# Patient Record
Sex: Male | Born: 1940 | Hispanic: No | Marital: Married | Smoking: Smoker, current status unknown
Health system: Southern US, Community
[De-identification: ages and names within clinical notes are randomized; demographics above are authoritative.]

## PROBLEM LIST (undated history)

## (undated) DIAGNOSIS — T8149XA Infection following a procedure, other surgical site, initial encounter: Secondary | ICD-10-CM

## (undated) DIAGNOSIS — K810 Acute cholecystitis: Secondary | ICD-10-CM

## (undated) DIAGNOSIS — E119 Type 2 diabetes mellitus without complications: Secondary | ICD-10-CM

## (undated) DIAGNOSIS — I2699 Other pulmonary embolism without acute cor pulmonale: Secondary | ICD-10-CM

## (undated) DIAGNOSIS — J181 Lobar pneumonia, unspecified organism: Secondary | ICD-10-CM

## (undated) DIAGNOSIS — I82409 Acute embolism and thrombosis of unspecified deep veins of unspecified lower extremity: Secondary | ICD-10-CM

## (undated) DIAGNOSIS — J9621 Acute and chronic respiratory failure with hypoxia: Secondary | ICD-10-CM

## (undated) HISTORY — PX: CHOLECYSTECTOMY: SHX55

---

## 2017-12-16 ENCOUNTER — Other Ambulatory Visit (HOSPITAL_COMMUNITY): Payer: Medicare Other

## 2017-12-16 ENCOUNTER — Inpatient Hospital Stay
Admission: RE | Admit: 2017-12-16 | Discharge: 2018-02-09 | Disposition: A | Discharge status: Skilled Nursing Facility | Payer: Medicare Other | Source: Ambulatory Visit | Attending: Internal Medicine | Admitting: Internal Medicine

## 2017-12-16 DIAGNOSIS — R1310 Dysphagia, unspecified: Secondary | ICD-10-CM

## 2017-12-16 DIAGNOSIS — K810 Acute cholecystitis: Secondary | ICD-10-CM

## 2017-12-16 DIAGNOSIS — T85598A Other mechanical complication of other gastrointestinal prosthetic devices, implants and grafts, initial encounter: Secondary | ICD-10-CM

## 2017-12-16 DIAGNOSIS — R0902 Hypoxemia: Secondary | ICD-10-CM

## 2017-12-16 DIAGNOSIS — T8149XA Infection following a procedure, other surgical site, initial encounter: Secondary | ICD-10-CM

## 2017-12-16 DIAGNOSIS — I10 Essential (primary) hypertension: Secondary | ICD-10-CM

## 2017-12-16 DIAGNOSIS — J9621 Acute and chronic respiratory failure with hypoxia: Secondary | ICD-10-CM

## 2017-12-16 DIAGNOSIS — J189 Pneumonia, unspecified organism: Secondary | ICD-10-CM

## 2017-12-16 DIAGNOSIS — T8143XA Infection following a procedure, organ and space surgical site, initial encounter: Secondary | ICD-10-CM

## 2017-12-16 DIAGNOSIS — I2699 Other pulmonary embolism without acute cor pulmonale: Secondary | ICD-10-CM

## 2017-12-16 DIAGNOSIS — I82409 Acute embolism and thrombosis of unspecified deep veins of unspecified lower extremity: Secondary | ICD-10-CM

## 2017-12-16 DIAGNOSIS — R0682 Tachypnea, not elsewhere classified: Secondary | ICD-10-CM

## 2017-12-16 DIAGNOSIS — Z0189 Encounter for other specified special examinations: Secondary | ICD-10-CM

## 2017-12-16 DIAGNOSIS — J181 Lobar pneumonia, unspecified organism: Secondary | ICD-10-CM

## 2017-12-16 DIAGNOSIS — K567 Ileus, unspecified: Secondary | ICD-10-CM

## 2017-12-16 DIAGNOSIS — R509 Fever, unspecified: Secondary | ICD-10-CM

## 2017-12-16 DIAGNOSIS — J9690 Respiratory failure, unspecified, unspecified whether with hypoxia or hypercapnia: Secondary | ICD-10-CM

## 2017-12-16 DIAGNOSIS — Z978 Presence of other specified devices: Secondary | ICD-10-CM

## 2017-12-16 HISTORY — DX: Lobar pneumonia, unspecified organism: J18.1

## 2017-12-16 HISTORY — DX: Other pulmonary embolism without acute cor pulmonale: I26.99

## 2017-12-16 HISTORY — DX: Infection following a procedure, other surgical site, initial encounter: T81.49XA

## 2017-12-16 HISTORY — DX: Acute and chronic respiratory failure with hypoxia: J96.21

## 2017-12-16 HISTORY — DX: Acute cholecystitis: K81.0

## 2017-12-16 HISTORY — DX: Acute embolism and thrombosis of unspecified deep veins of unspecified lower extremity: I82.409

## 2017-12-16 HISTORY — DX: Type 2 diabetes mellitus without complications: E11.9

## 2017-12-16 LAB — CBC WITH DIFFERENTIAL/PLATELET
Basophils Absolute: 0.1 10*3/uL (ref 0.0–0.1)
Basophils Relative: 1 %
EOS PCT: 2 %
Eosinophils Absolute: 0.2 10*3/uL (ref 0.0–0.7)
HEMATOCRIT: 28.2 % — AB (ref 39.0–52.0)
Hemoglobin: 8.3 g/dL — ABNORMAL LOW (ref 13.0–17.0)
LYMPHS ABS: 1.7 10*3/uL (ref 0.7–4.0)
Lymphocytes Relative: 16 %
MCH: 29.6 pg (ref 26.0–34.0)
MCHC: 29.4 g/dL — ABNORMAL LOW (ref 30.0–36.0)
MCV: 100.7 fL — AB (ref 78.0–100.0)
MONOS PCT: 8 %
Monocytes Absolute: 0.9 10*3/uL (ref 0.1–1.0)
Neutro Abs: 8 10*3/uL — ABNORMAL HIGH (ref 1.7–7.7)
Neutrophils Relative %: 73 %
Platelets: 202 10*3/uL (ref 150–400)
RBC: 2.8 MIL/uL — AB (ref 4.22–5.81)
RDW: 17.7 % — ABNORMAL HIGH (ref 11.5–15.5)
WBC: 10.9 10*3/uL — ABNORMAL HIGH (ref 4.0–10.5)

## 2017-12-16 LAB — HEPARIN LEVEL (UNFRACTIONATED): Heparin Unfractionated: 0.64 IU/mL (ref 0.30–0.70)

## 2017-12-16 LAB — BLOOD GAS, ARTERIAL
ACID-BASE EXCESS: 0.4 mmol/L (ref 0.0–2.0)
BICARBONATE: 26.1 mmol/L (ref 20.0–28.0)
FIO2: 40
LHR: 16 {breaths}/min
O2 Saturation: 95.6 %
PEEP/CPAP: 5 cmH2O
PRESSURE CONTROL: 18 cmH2O
Patient temperature: 98.6
pCO2 arterial: 54.9 mmHg — ABNORMAL HIGH (ref 32.0–48.0)
pH, Arterial: 7.298 — ABNORMAL LOW (ref 7.350–7.450)
pO2, Arterial: 78.5 mmHg — ABNORMAL LOW (ref 83.0–108.0)

## 2017-12-16 LAB — APTT: APTT: 120 s — AB (ref 24–36)

## 2017-12-16 LAB — PROTIME-INR
INR: 1.33
Prothrombin Time: 16.4 seconds — ABNORMAL HIGH (ref 11.4–15.2)

## 2017-12-17 ENCOUNTER — Other Ambulatory Visit (HOSPITAL_COMMUNITY): Payer: Medicare Other

## 2017-12-17 LAB — CBC
HEMATOCRIT: 24.8 % — AB (ref 39.0–52.0)
HEMOGLOBIN: 7.6 g/dL — AB (ref 13.0–17.0)
MCH: 30.3 pg (ref 26.0–34.0)
MCHC: 30.6 g/dL (ref 30.0–36.0)
MCV: 98.8 fL (ref 78.0–100.0)
Platelets: 205 10*3/uL (ref 150–400)
RBC: 2.51 MIL/uL — ABNORMAL LOW (ref 4.22–5.81)
RDW: 17.7 % — ABNORMAL HIGH (ref 11.5–15.5)
WBC: 10.9 10*3/uL — AB (ref 4.0–10.5)

## 2017-12-17 LAB — COMPREHENSIVE METABOLIC PANEL
ALBUMIN: 1.4 g/dL — AB (ref 3.5–5.0)
ALK PHOS: 105 U/L (ref 38–126)
ALT: 97 U/L — ABNORMAL HIGH (ref 17–63)
AST: 89 U/L — AB (ref 15–41)
Anion gap: 8 (ref 5–15)
BUN: 92 mg/dL — AB (ref 6–20)
CALCIUM: 8.2 mg/dL — AB (ref 8.9–10.3)
CO2: 24 mmol/L (ref 22–32)
CREATININE: 2.86 mg/dL — AB (ref 0.61–1.24)
Chloride: 112 mmol/L — ABNORMAL HIGH (ref 101–111)
GFR calc Af Amer: 23 mL/min — ABNORMAL LOW (ref >60)
GFR calc non Af Amer: 20 mL/min — ABNORMAL LOW (ref >60)
GLUCOSE: 130 mg/dL — AB (ref 65–99)
Potassium: 4.2 mmol/L (ref 3.5–5.1)
Sodium: 144 mmol/L (ref 135–145)
TOTAL PROTEIN: 6.8 g/dL (ref 6.5–8.1)
Total Bilirubin: 0.4 mg/dL (ref 0.3–1.2)

## 2017-12-17 LAB — HEPARIN LEVEL (UNFRACTIONATED)
Heparin Unfractionated: 0.54 IU/mL (ref 0.30–0.70)
Heparin Unfractionated: 0.4 IU/mL (ref 0.30–0.70)
Heparin Unfractionated: 0.55 IU/mL (ref 0.30–0.70)

## 2017-12-17 NOTE — H&P (Addendum)
Chief Complaint: Long term vent dependence  Referring Physician(s): Hijazi  Supervising Physician: Richarda Overlie  Patient Status: [name] - In-pt  History of Present Illness: Rodney Cordova is a 77 y.o. male who underwent a laparoscopic cholecystectomy on 11/11/2017 for gangrenous cholecystitis.  He continued to have abdominal pain and was found to have a large fluid collection in the GB fossa.  Perc drain was placed at an outside facility.  His hospital stay has been complicated by DVT. He was on Eliquis but the wife says "it didn't touch it so they put him on Heparin".  He is currently on a heparin drip. He remains intubated as he has not been able to wean.  We are asked to evaluate him for percutaneous gastrostomy tube placement.  His wife tells me that "they are planning a trach and PEG".   Allergies: Patient has no allergy information on record.  Medications: Currently on Heparin drip       Social History   Socioeconomic History  . Marital status: Married    Spouse name: Not on file  . Number of children: Not on file  . Years of education: Not on file  . Highest education level: Not on file  Occupational History  . Not on file  Social Needs  . Financial resource strain: Not on file  . Food insecurity:    Worry: Not on file    Inability: Not on file  . Transportation needs:    Medical: Not on file    Non-medical: Not on file  Tobacco Use  . Smoking status: Not on file  Substance and Sexual Activity  . Alcohol use: Not on file  . Drug use: Not on file  . Sexual activity: Not on file  Lifestyle  . Physical activity:    Days per week: Not on file    Minutes per session: Not on file  . Stress: Not on file  Relationships  . Social connections:    Talks on phone: Not on file    Gets together: Not on file    Attends religious service: Not on file    Active member of club or organization: Not on file    Attends meetings of clubs or organizations: Not on  file    Relationship status: Not on file  Other Topics Concern  . Not on file  Social History Narrative  . Not on file    Review of Systems  Unable to perform ROS: Intubated    Vital Signs: There were no vitals taken for this visit.  Physical Exam  Constitutional: He appears well-developed.  HENT:  Head: Normocephalic.  Cardiovascular: Normal rate and regular rhythm.  Pulmonary/Chest:  Intubated, on vent Rhonchi bilaterally  Abdominal:  RUQ drain in place with brown drainage  Neurological:  Does not open eyes to stimulation    Imaging: Dg Chest Port 1 View  Result Date: 12/16/2017 CLINICAL DATA:  Endotracheal tube placement. EXAM: PORTABLE CHEST 1 VIEW COMPARISON:  None. FINDINGS: Endotracheal tube terminates 2.5 cm above the carina. The balloon may be hyperinflated. Nasogastric tube terminates in the stomach. Left IJ central line tip may be directed posteriorly within the azygos vein. There is patchy bibasilar airspace opacification. Suspect a small right pleural effusion. IMPRESSION: 1. Endotracheal tube is slightly low lying. Pulling back approximately 1 cm would likely better position the tip above the carina. Difficult to exclude associated balloon hyperinflation. 2. Left IJ central line tip may be directed posteriorly, within the azygos vein. 3.  Patchy bilateral airspace opacification may be due to edema or pneumonia. 4. Probable right pleural effusion. Electronically Signed   By: Leanna BattlesMelinda  Blietz M.D.   On: 12/16/2017 15:21   Dg Abd Portable 1v  Result Date: 12/16/2017 CLINICAL DATA:  Nasogastric tube placement. EXAM: PORTABLE ABDOMEN - 1 VIEW COMPARISON:  None. FINDINGS: Nasogastric tube terminates in the stomach. IVC filter is noted. Gas is seen in the colon. IMPRESSION: Nasogastric tube terminates in the stomach. Electronically Signed   By: Leanna BattlesMelinda  Blietz M.D.   On: 12/16/2017 15:22    Labs:  CBC: Recent Labs    12/16/17 1603 12/17/17 0739  WBC 10.9* 10.9*    HGB 8.3* 7.6*  HCT 28.2* 24.8*  PLT 202 205    COAGS: Recent Labs    12/16/17 1603  INR 1.33  APTT 120*    BMP: Recent Labs    12/17/17 0739  NA 144  K 4.2  CL 112*  CO2 24  GLUCOSE 130*  BUN 92*  CALCIUM 8.2*  CREATININE 2.86*  GFRNONAA 20*  GFRAA 23*    LIVER FUNCTION TESTS: Recent Labs    12/17/17 0739  BILITOT 0.4  AST 89*  ALT 97*  ALKPHOS 105  PROT 6.8  ALBUMIN 1.4*    TUMOR MARKERS: No results for input(s): AFPTM, CEA, CA199, CHROMGRNA in the last 8760 hours.  Assessment and Plan:  Recent surgery for gangrenous cholecystitis with development of post op fluid collection.  RUQ drain remains in place.  Patient on Heparin drip for DVT.  Asked to evaluate the drain and evaluate for gastrostomy tube placement.  CT scan with oral contrast has been ordered. Cannot give IV contrast due to CKD with creatinine of 3.  May go to OR for Trach this week.  Thank you for this interesting consult.  I greatly enjoyed meeting Rodney Cordova and look forward to participating in their care.  A copy of this report was sent to the requesting provider on this date.  Electronically Signed: Gwynneth MacleodWENDY S BLAIR, PA-C 12/17/2017, 10:26 AM   I spent a total of 40 Minutes in face to face in clinical consultation, greater than 50% of which was counseling/coordinating care for eval drain from outside facility and eval for gtube.

## 2017-12-18 ENCOUNTER — Encounter: Payer: Self-pay | Admitting: Internal Medicine

## 2017-12-18 DIAGNOSIS — J181 Lobar pneumonia, unspecified organism: Secondary | ICD-10-CM | POA: Diagnosis not present

## 2017-12-18 DIAGNOSIS — I82409 Acute embolism and thrombosis of unspecified deep veins of unspecified lower extremity: Secondary | ICD-10-CM

## 2017-12-18 DIAGNOSIS — T8149XA Infection following a procedure, other surgical site, initial encounter: Secondary | ICD-10-CM | POA: Diagnosis not present

## 2017-12-18 DIAGNOSIS — I2699 Other pulmonary embolism without acute cor pulmonale: Secondary | ICD-10-CM

## 2017-12-18 DIAGNOSIS — J9621 Acute and chronic respiratory failure with hypoxia: Secondary | ICD-10-CM | POA: Diagnosis not present

## 2017-12-18 DIAGNOSIS — K810 Acute cholecystitis: Secondary | ICD-10-CM

## 2017-12-18 DIAGNOSIS — T8143XA Infection following a procedure, organ and space surgical site, initial encounter: Secondary | ICD-10-CM

## 2017-12-18 DIAGNOSIS — J8489 Other specified interstitial pulmonary diseases: Secondary | ICD-10-CM | POA: Diagnosis not present

## 2017-12-18 DIAGNOSIS — I10 Essential (primary) hypertension: Secondary | ICD-10-CM

## 2017-12-18 DIAGNOSIS — K651 Peritoneal abscess: Secondary | ICD-10-CM

## 2017-12-18 DIAGNOSIS — J15212 Pneumonia due to Methicillin resistant Staphylococcus aureus: Secondary | ICD-10-CM | POA: Diagnosis not present

## 2017-12-18 HISTORY — DX: Infection following a procedure, organ and space surgical site, initial encounter: T81.43XA

## 2017-12-18 HISTORY — DX: Lobar pneumonia, unspecified organism: J18.1

## 2017-12-18 HISTORY — DX: Acute embolism and thrombosis of unspecified deep veins of unspecified lower extremity: I82.409

## 2017-12-18 HISTORY — DX: Acute cholecystitis: K81.0

## 2017-12-18 HISTORY — DX: Other pulmonary embolism without acute cor pulmonale: I26.99

## 2017-12-18 HISTORY — DX: Peritoneal abscess: K65.1

## 2017-12-18 HISTORY — DX: Acute and chronic respiratory failure with hypoxia: J96.21

## 2017-12-18 LAB — HEPARIN LEVEL (UNFRACTIONATED)
Heparin Unfractionated: 0.38 IU/mL (ref 0.30–0.70)
Heparin Unfractionated: 0.22 IU/mL — ABNORMAL LOW (ref 0.30–0.70)
Heparin Unfractionated: 0.31 IU/mL (ref 0.30–0.70)

## 2017-12-18 NOTE — Progress Notes (Signed)
Patient ID: Rodney Cordova, male   DOB: 10/27/1940, 77 y.o.   MRN: [id number]   Request for percutaneous gastric tube placement  Imaging reviewed with Dr Miles CostainShick  CT 3/24: IMPRESSION: Pigtail drainage catheter in the right subhepatic space with only small amount of fluid which may represent loculated ascites, or potentially infection. Comparison with any outside imaging may be useful.  Circumferential colonic wall thickening involving cecum, ascending colon, hepatic flexure representing nonspecific colitis, potentially infectious or inflammatory.  Mixed ground-glass and nodular opacities of the bilateral lung bases, left greater than right. Differential includes multifocal infection, asymmetric edema, and/or chronic changes.  Right-sided pleural effusion.  L1 compression fracture with questionable disruption of the superior endplate and approximately 50% height loss anteriorly. If there is concern for acute component, MRI recommended as the test of choice to assess for edema.  Gas within the urinary bladder, presumably secondary to the indwelling balloon retention catheter.  Diverticular disease without evidence of acute diverticulitis.  Aortic Atherosclerosis (ICD10-I70.0).   Many issues with this patient that needs to be addressed and resolved Before IR can safely place a percutaneous tube per Dr Miles CostainShick  Will cancel request for now Please address all issues Re order when pt mor appropriate

## 2017-12-18 NOTE — Consult Note (Addendum)
Pulmonary Critical Care Medicine [facility] PULMONARY SERVICE  Date of Service: December 18, 2017   consult note   Rodney Cordova  ZOX:[id number]  DOB: 08-Sep-1941   DOA: 12/16/2017  Referring Physician: Carron Curie, MD   HPI: Rodney Cordova is a 77 y.o. male seen for follow up of Acute on Chronic Respiratory Failure.  Patient has a rather complicated history presented to the hospital for a laparoscopic cholecystectomy.  Patient was apparently found to gangrenous cholecystitis about 2 weeks prior to admission.  Patient had a drain placed and had significant complications with bruising in the right flanks.  He was evaluated for right lower extremity DVT which was apparently positive and also had other complications despite anticoagulation with presentation with pulmonary embolism.  Hospital course patient was started on heparin for anticoagulation.  Had a drain placed for a gallbladder fossa abscess.  In addition he was found to have new knee effusions.  Patient was started on broad-spectrum antibiotics.  In addition patient was found to have a pneumonia for which he was treated.  Patient had been extubated however failed and had to be placed back on the ventilator.  Presents to our facility on the ventilator and has been on full support and pressure assist-control mode currently with 40% oxygen.   Review of Systems:   ROS performed and is unremarkable other than noted above.  Past Medical History:  Diagnosis Date  . Acute cholecystitis 12/18/2017  . Acute DVT (deep venous thrombosis) (HCC) 12/18/2017  . Acute on chronic respiratory failure with hypoxia (HCC) 12/18/2017  . Lobar pneumonia (HCC) 12/18/2017  . Postoperative intra-abdominal abscess 12/18/2017  . Pulmonary emboli (HCC) 12/18/2017    Past Surgical History:  Procedure Laterality Date  . CHOLECYSTECTOMY      Social History:    reports that he has been smoking.  He has never used smokeless tobacco. He reports that he  drank alcohol. He reports that he has current or past drug history.  Family History: Non-Contributory to the present illness  Allergies not on file  Medications:  Reviewed on Rounds  Physical Exam:  Vitals:   Temperature 98.4 degrees pulse 118 respiratory rate 26 blood pressure 147/57 saturations 97%  Ventilator Settings  Mode of ventilation pressure assist-control FiO2 40% tidal volume 600 cc peep 5  . General: Comfortable at this time . Eyes: Grossly normal lids, irises & conjunctiva . ENT: grossly tongue is normal . Neck: no obvious mass . Cardiovascular:  S1-S2 normal no gallop . Respiratory:  Coarse breath sounds bilaterally . Abdomen:  Distended but soft . Skin: no rash seen on limited exam . Musculoskeletal: not rigid . Psychiatric:unable to assess . Neurologic: no seizure no involuntary movements          Labs on Admission:  Basic Metabolic Panel: Recent Labs  Lab 12/17/17 0739  NA 144  K 4.2  CL 112*  CO2 24  GLUCOSE 130*  BUN 92*  CREATININE 2.86*  CALCIUM 8.2*    Liver Function Tests: Recent Labs  Lab 12/17/17 0739  AST 89*  ALT 97*  ALKPHOS 105  BILITOT 0.4  PROT 6.8  ALBUMIN 1.4*   No results for input(s): LIPASE, AMYLASE in the last 168 hours. No results for input(s): AMMONIA in the last 168 hours.  CBC: Recent Labs  Lab 12/16/17 1603 12/17/17 0739  WBC 10.9* 10.9*  NEUTROABS 8.0*  --   HGB 8.3* 7.6*  HCT 28.2* 24.8*  MCV 100.7* 98.8  PLT 202 205  Cardiac Enzymes: No results for input(s): CKTOTAL, CKMB, CKMBINDEX, TROPONINI in the last 168 hours.  BNP (last 3 results) No results for input(s): BNP in the last 8760 hours.  ProBNP (last 3 results) No results for input(s): PROBNP in the last 8760 hours.   Radiological Exams on Admission: Ct Abdomen Pelvis Wo Contrast  Result Date: 12/18/2017 CLINICAL DATA:  77 year old male with a history of prior abdominal drainage EXAM: CT ABDOMEN AND PELVIS WITHOUT CONTRAST  TECHNIQUE: Multidetector CT imaging of the abdomen and pelvis was performed following the standard protocol without IV contrast. COMPARISON:  None. FINDINGS: Lower chest: Mixed ground-glass and nodular opacities of the left greater than right lower lobes. Mild ground-glass opacity at the pendant aspects of the right middle lobe. Right-sided pleural effusion. Hepatobiliary: Streak artifact somewhat limits evaluation of the liver parenchyma. Surgical changes of cholecystectomy. Pancreas: Unremarkable appearance of pancreas Spleen: Unremarkable spleen Adrenals/Urinary Tract: Unremarkable appearance of the adrenal glands. No evidence of hydronephrosis or nephrolithiasis. Unremarkable course of the bilateral ureters. Balloon catheter within the urinary bladder. Gas within the urinary bladder, anti dependent aspects. Bladder is relatively decompressed. No significant inflammatory changes. Stomach/Bowel: Gastric tube terminates within the stomach lumen. Small bowel decompressed without abnormal distention or focal wall thickening. No inflammatory changes within the small bowel mesentery. Enteric contrast reaches the distal small bowel/colon. Circumferential wall thickening of the cecum, ascending colon, hepatic flexure. No evidence of transition point. Colonic diverticular disease of the sigmoid colon without associated inflammatory changes. Normal appendix. Vascular/Lymphatic: Minimal calcifications of the aorta. IVC filter in position. Reproductive: Unremarkable appearance of prostate. Other: Bilateral fat containing inguinal hernia. Small fat contai ing umbilical hernia. Pigtail drainage catheter in the subhepatic space. There is trace fluid adjacent to the catheter, tracking caudally within the fascial planes posterior to the pericolic gutter. Musculoskeletal: Compression fracture of L1 with approximately 50% anterior height loss. Questionable disruption of the superior endplate. No retropulsion of fracture fragments  or canal narrowing at the L1 level. Multilevel vacuum disc phenomenon of T11-T12, L1-L2, L4-L5. Advanced facet disease in the lower lumbar levels. 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 IMPRESSION: Pigtail drainage catheter in the right subhepatic space with only small amount of fluid which may represent loculated ascites, or potentially infection. Comparison with any outside imaging may be useful. Circumferential colonic wall thickening involving cecum, ascending colon, hepatic flexure representing nonspecific colitis, potentially infectious or inflammatory. Mixed ground-glass and nodular opacities of the bilateral lung bases, left greater than right. Differential includes multifocal infection, asymmetric edema, and/or chronic changes. Right-sided pleural effusion. L1 compression fracture with questionable disruption of the superior endplate and approximately 50% height loss anteriorly. If there is concern for acute component, MRI recommended as the test of choice to assess for edema. Gas within the urinary bladder, presumably secondary to the indwelling balloon retention catheter. Diverticular disease without evidence of acute diverticulitis. Aortic Atherosclerosis (ICD10-I70.0). Electronically Signed   By: Gilmer MorJaime  Wagner D.O.   On: 12/18/2017 08:18   Dg Chest Port 1 View  Result Date: 12/16/2017 CLINICAL DATA:  Endotracheal tube placement. EXAM: PORTABLE CHEST 1 VIEW COMPARISON:  None. FINDINGS: Endotracheal tube terminates 2.5 cm above the carina. The balloon may be hyperinflated. Nasogastric tube terminates in the stomach. Left IJ central line tip may be directed posteriorly within the azygos vein. There is patchy bibasilar airspace opacification. Suspect a small right pleural effusion. IMPRESSION: 1. Endotracheal tube is slightly low lying. Pulling back approximately 1 cm would likely better position the tip above the carina.  Difficult to exclude associated balloon hyperinflation. 2. Left IJ  central line tip may be directed posteriorly, within the azygos vein. 3. Patchy bilateral airspace opacification may be due to edema or pneumonia. 4. Probable right pleural effusion. Electronically Signed   By: Leanna Battles M.D.   On: 12/16/2017 15:21   Dg Abd Portable 1v  Result Date: 12/16/2017 CLINICAL DATA:  Nasogastric tube placement. EXAM: PORTABLE ABDOMEN - 1 VIEW COMPARISON:  None. FINDINGS: Nasogastric tube terminates in the stomach. IVC filter is noted. Gas is seen in the colon. IMPRESSION: Nasogastric tube terminates in the stomach. Electronically Signed   By: Leanna Battles M.D.   On: 12/16/2017 15:22   Assessment/Plan Active Problems:   Pulmonary emboli (HCC)   Acute DVT (deep venous thrombosis) (HCC)   Postoperative intra-abdominal abscess   Acute on chronic respiratory failure with hypoxia (HCC)   Acute cholecystitis   Lobar pneumonia (HCC)   Essential hypertension   1.  acute on chronic respiratory failure with hypoxia at this time patient is on full vent support on pressure assist-control mode.  Based on the history patient is been intubated for about 12 days has failed extubation trial.  It is more than probable the patient is going to need a tracheostomy.  ENT consultation has been requested 2. Pulmonary embolism patient has been treated with anticoagulation Will continue with current management medications were reviewed 3. Lobar pneumonia patient has been treated with antibiotics we will continue with supportive care follow-up x-rays still showing patchy bilateral airspace disease patient has also undergone bronchoscopy for this at the other facility 4. Intra-abdominal abscess need to continue to monitor patient has had drains placed previously   I have personally seen and evaluated the patient, evaluated laboratory and imaging results, formulated the assessment and plan and placed orders. The Patient requires high complexity decision making for assessment and support.   Case was discussed on Rounds with the Respiratory Therapy Staff  time spent 70 min  Yevonne Pax, MD [facility] Pulmonary Critical Care Medicine Sleep Medicine

## 2017-12-19 LAB — HEPARIN LEVEL (UNFRACTIONATED)
HEPARIN UNFRACTIONATED: 0.37 [IU]/mL (ref 0.30–0.70)
HEPARIN UNFRACTIONATED: 0.16 [IU]/mL — AB (ref 0.30–0.70)
Heparin Unfractionated: 0.37 IU/mL (ref 0.30–0.70)

## 2017-12-20 DIAGNOSIS — T8149XA Infection following a procedure, other surgical site, initial encounter: Secondary | ICD-10-CM | POA: Diagnosis not present

## 2017-12-20 DIAGNOSIS — J9621 Acute and chronic respiratory failure with hypoxia: Secondary | ICD-10-CM | POA: Diagnosis not present

## 2017-12-20 DIAGNOSIS — J181 Lobar pneumonia, unspecified organism: Secondary | ICD-10-CM | POA: Diagnosis not present

## 2017-12-20 DIAGNOSIS — I2699 Other pulmonary embolism without acute cor pulmonale: Secondary | ICD-10-CM | POA: Diagnosis not present

## 2017-12-20 LAB — BLOOD GAS, ARTERIAL
ACID-BASE DEFICIT: 2.1 mmol/L — AB (ref 0.0–2.0)
Bicarbonate: 23.3 mmol/L (ref 20.0–28.0)
FIO2: 40
MECHVT: 450 mL
O2 Saturation: 98.1 %
PEEP/CPAP: 5 cmH2O
PO2 ART: 115 mmHg — AB (ref 83.0–108.0)
Patient temperature: 99.9
RATE: 16 resp/min
pCO2 arterial: 50.2 mmHg — ABNORMAL HIGH (ref 32.0–48.0)
pH, Arterial: 7.294 — ABNORMAL LOW (ref 7.350–7.450)

## 2017-12-20 LAB — BASIC METABOLIC PANEL
ANION GAP: 9 (ref 5–15)
BUN: 98 mg/dL — ABNORMAL HIGH (ref 6–20)
CO2: 23 mmol/L (ref 22–32)
Calcium: 7.9 mg/dL — ABNORMAL LOW (ref 8.9–10.3)
Chloride: 114 mmol/L — ABNORMAL HIGH (ref 101–111)
Creatinine, Ser: 3.07 mg/dL — ABNORMAL HIGH (ref 0.61–1.24)
GFR calc non Af Amer: 18 mL/min — ABNORMAL LOW (ref >60)
GFR, EST AFRICAN AMERICAN: 21 mL/min — AB (ref >60)
GLUCOSE: 131 mg/dL — AB (ref 65–99)
POTASSIUM: 3.5 mmol/L (ref 3.5–5.1)
Sodium: 146 mmol/L — ABNORMAL HIGH (ref 135–145)

## 2017-12-20 LAB — HEPARIN LEVEL (UNFRACTIONATED)
HEPARIN UNFRACTIONATED: 0.32 [IU]/mL (ref 0.30–0.70)
HEPARIN UNFRACTIONATED: 0.35 [IU]/mL (ref 0.30–0.70)
HEPARIN UNFRACTIONATED: 0.38 [IU]/mL (ref 0.30–0.70)
Heparin Unfractionated: 0.37 IU/mL (ref 0.30–0.70)

## 2017-12-20 LAB — CBC
HEMATOCRIT: 26.3 % — AB (ref 39.0–52.0)
Hemoglobin: 7.8 g/dL — ABNORMAL LOW (ref 13.0–17.0)
MCH: 28.6 pg (ref 26.0–34.0)
MCHC: 29.7 g/dL — ABNORMAL LOW (ref 30.0–36.0)
MCV: 96.3 fL (ref 78.0–100.0)
Platelets: 210 10*3/uL (ref 150–400)
RBC: 2.73 MIL/uL — AB (ref 4.22–5.81)
RDW: 16.4 % — ABNORMAL HIGH (ref 11.5–15.5)
WBC: 5.9 10*3/uL (ref 4.0–10.5)

## 2017-12-20 NOTE — Progress Notes (Signed)
[facility]   [facility]  PROGRESS NOTE  Date of Service:  December 20, 2017  Rodney Cordova  ZOX:[id number]  DOB: 03/12/1941   DOA: 12/16/2017  Referring Physician: Carron CurieAli Hijazi, MD  HPI: Rodney Cordova is a 77 y.o. male seen for follow up of Acute on Chronic Respiratory Failure.  Patient is on pressure support mode supposed to have a tracheostomy done in the morning.  Apparently was able to do 8 hr on pressure support  Medications: Reviewed on Rounds  Physical Exam:  Vitals:  Temperature 98.4 degrees pulse 95 respiratory 27 blood pressure 138/74 saturations 97%  Ventilator Settings  Mode of ventilation pressure support FiO2 35% tidal volume 425 pressure support 12 peep 5  . General: Comfortable at this time . Eyes: Grossly normal lids, irises & conjunctiva . ENT: grossly tongue is normal . Neck: no obvious mass . Cardiovascular:  S1-S2 normal no gallop . Respiratory:  No rhonchi expansion equal . Abdomen:  Soft nontender . Skin: no rash seen on limited exam . Musculoskeletal: not rigid . Psychiatric:unable to assess . Neurologic: no seizure no involuntary movements         Labs on Admission:  Basic Metabolic Panel: Recent Labs  Lab 12/17/17 0739 12/20/17 0130  NA 144 146*  K 4.2 3.5  CL 112* 114*  CO2 24 23  GLUCOSE 130* 131*  BUN 92* 98*  CREATININE 2.86* 3.07*  CALCIUM 8.2* 7.9*    Liver Function Tests: Recent Labs  Lab 12/17/17 0739  AST 89*  ALT 97*  ALKPHOS 105  BILITOT 0.4  PROT 6.8  ALBUMIN 1.4*   No results for input(s): LIPASE, AMYLASE in the last 168 hours. No results for input(s): AMMONIA in the last 168 hours.  CBC: Recent Labs  Lab 12/16/17 1603 12/17/17 0739 12/20/17 0130  WBC 10.9* 10.9* 5.9  NEUTROABS 8.0*  --   --   HGB 8.3* 7.6* 7.8*  HCT 28.2* 24.8* 26.3*  MCV 100.7* 98.8 96.3  PLT 202 205 210    Cardiac Enzymes: No results for input(s): CKTOTAL, CKMB, CKMBINDEX,  TROPONINI in the last 168 hours.  BNP (last 3 results) No results for input(s): BNP in the last 8760 hours.  ProBNP (last 3 results) No results for input(s): PROBNP in the last 8760 hours.  Radiological Exams on Admission: Ct Abdomen Pelvis Wo Contrast  Result Date: 12/18/2017 CLINICAL DATA:  77 year old male with a history of prior abdominal drainage EXAM: CT ABDOMEN AND PELVIS WITHOUT CONTRAST TECHNIQUE: Multidetector CT imaging of the abdomen and pelvis was performed following the standard protocol without IV contrast. COMPARISON:  None. FINDINGS: Lower chest: Mixed ground-glass and nodular opacities of the left greater than right lower lobes. Mild ground-glass opacity at the pendant aspects of the right middle lobe. Right-sided pleural effusion. Hepatobiliary: Streak artifact somewhat limits evaluation of the liver parenchyma. Surgical changes of cholecystectomy. Pancreas: Unremarkable appearance of pancreas Spleen: Unremarkable spleen Adrenals/Urinary Tract: Unremarkable appearance of the adrenal glands. No evidence of hydronephrosis or nephrolithiasis. Unremarkable course of the bilateral ureters. Balloon catheter within the urinary bladder. Gas within the urinary bladder, anti dependent aspects. Bladder is relatively decompressed. No significant inflammatory changes. Stomach/Bowel: Gastric tube terminates within the stomach lumen. Small bowel decompressed without abnormal distention or focal wall thickening. No inflammatory changes within the small bowel mesentery. Enteric contrast reaches the distal small bowel/colon. Circumferential wall thickening of the cecum, ascending colon, hepatic flexure. No evidence of transition point. Colonic diverticular disease  of the sigmoid colon without associated inflammatory changes. Normal appendix. Vascular/Lymphatic: Minimal calcifications of the aorta. IVC filter in position. Reproductive: Unremarkable appearance of prostate. Other: Bilateral fat containing  inguinal hernia. Small fat contai ing umbilical hernia. Pigtail drainage catheter in the subhepatic space. There is trace fluid adjacent to the catheter, tracking caudally within the fascial planes posterior to the pericolic gutter. Musculoskeletal: Compression fracture of L1 with approximately 50% anterior height loss. Questionable disruption of the superior endplate. No retropulsion of fracture fragments or canal narrowing at the L1 level. Multilevel vacuum disc phenomenon of T11-T12, L1-L2, L4-L5. Advanced facet disease in the lower lumbar levels. 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 IMPRESSION: Pigtail drainage catheter in the right subhepatic space with only small amount of fluid which may represent loculated ascites, or potentially infection. Comparison with any outside imaging may be useful. Circumferential colonic wall thickening involving cecum, ascending colon, hepatic flexure representing nonspecific colitis, potentially infectious or inflammatory. Mixed ground-glass and nodular opacities of the bilateral lung bases, left greater than right. Differential includes multifocal infection, asymmetric edema, and/or chronic changes. Right-sided pleural effusion. L1 compression fracture with questionable disruption of the superior endplate and approximately 50% height loss anteriorly. If there is concern for acute component, MRI recommended as the test of choice to assess for edema. Gas within the urinary bladder, presumably secondary to the indwelling balloon retention catheter. Diverticular disease without evidence of acute diverticulitis. Aortic Atherosclerosis (ICD10-I70.0). Electronically Signed   By: Gilmer Mor D.O.   On: 12/18/2017 08:18    Assessment/Plan Active Problems:   Pulmonary emboli (HCC)   Acute DVT (deep venous thrombosis) (HCC)   Postoperative intra-abdominal abscess   Acute on chronic respiratory failure with hypoxia (HCC)   Acute cholecystitis   Lobar pneumonia  (HCC)   Essential hypertension   1.  acute on chronic Respiratory failure with hypoxia we will continue to wean hopefully after the tracheostomy we can move more aggressively towards aerosolized T collar.   2. Lobar pneumonia treated with antibiotics continue to monitor  3. Pulmonary embolism we will continue with supportive care 4. Postoperative intra-abdominal abscess will continue to monitor treated with antibiotics   I have personally seen and evaluated the patient, evaluated laboratory and imaging results, formulated the assessment and plan and placed orders. The Patient requires high complexity decision making for assessment and support.  Case was discussed on Rounds with the Respiratory Therapy Staff  Yevonne Pax, MD [facility] Pulmonary Critical Care Medicine Sleep Medicine

## 2017-12-20 NOTE — Anesthesia Preprocedure Evaluation (Addendum)
Anesthesia Evaluation  Patient identified by MRN, date of birth, ID band Patient awake    Reviewed: Allergy & Precautions, NPO status , Patient's Chart, lab work & pertinent test results, Unable to perform ROS - Chart review only  Airway Mallampati: Intubated       Dental   Pulmonary pneumonia, PE Respiratory failure    + decreased breath sounds      Cardiovascular + DVT   Rhythm:Regular Rate:Normal     Neuro/Psych negative neurological ROS  negative psych ROS   GI/Hepatic negative GI ROS, Neg liver ROS,   Endo/Other  negative endocrine ROS  Renal/GU CRFRenal disease  negative genitourinary   Musculoskeletal negative musculoskeletal ROS (+)   Abdominal   Peds  Hematology  (+) anemia ,   Anesthesia Other Findings s/p lap chole on 11/11/2017 for gangrenous cholecystitis. He continued to have abdominal pain and was found to have a large fluid collection in the GB fossa. Perc drain was placed at an outside facility. His hospital stay was been complicated by DVT/PE and pneumonia. He remains intubated, now for approx past 2 weeks.  Reproductive/Obstetrics                             Anesthesia Physical Anesthesia Plan  ASA: IV  Anesthesia Plan: General   Post-op Pain Management:    Induction: Inhalational  PONV Risk Score and Plan: 2 and Treatment may vary due to age or medical condition and Ondansetron  Airway Management Planned: Oral ETT  Additional Equipment: None  Intra-op Plan:   Post-operative Plan: Extubation in OR  Informed Consent: I have reviewed the patients History and Physical, chart, labs and discussed the procedure including the risks, benefits and alternatives for the proposed anesthesia with the patient or authorized representative who has indicated his/her understanding and acceptance.   Dental advisory given  Plan Discussed with: CRNA and  Anesthesiologist  Anesthesia Plan Comments:         Anesthesia Quick Evaluation

## 2017-12-21 ENCOUNTER — Encounter (HOSPITAL_COMMUNITY): Payer: Medicare Other | Admitting: Certified Registered Nurse Anesthetist

## 2017-12-21 ENCOUNTER — Encounter
Admission: RE | Disposition: A | Discharge status: Skilled Nursing Facility | Payer: Self-pay | Source: Ambulatory Visit | Attending: Internal Medicine

## 2017-12-21 ENCOUNTER — Other Ambulatory Visit (HOSPITAL_COMMUNITY): Payer: Medicare Other

## 2017-12-21 DIAGNOSIS — I2699 Other pulmonary embolism without acute cor pulmonale: Secondary | ICD-10-CM | POA: Diagnosis not present

## 2017-12-21 DIAGNOSIS — J9621 Acute and chronic respiratory failure with hypoxia: Secondary | ICD-10-CM | POA: Diagnosis not present

## 2017-12-21 DIAGNOSIS — T8149XA Infection following a procedure, other surgical site, initial encounter: Secondary | ICD-10-CM | POA: Diagnosis not present

## 2017-12-21 DIAGNOSIS — J181 Lobar pneumonia, unspecified organism: Secondary | ICD-10-CM | POA: Diagnosis not present

## 2017-12-21 HISTORY — PX: TRACHEOSTOMY TUBE PLACEMENT: SHX814

## 2017-12-21 LAB — ABO/RH: ABO/RH(D): O POS

## 2017-12-21 LAB — PREPARE RBC (CROSSMATCH)

## 2017-12-21 SURGERY — CREATION, TRACHEOSTOMY
Anesthesia: General | Site: Neck

## 2017-12-21 MED ORDER — ONDANSETRON HCL 4 MG/2ML IJ SOLN
INTRAMUSCULAR | Status: AC
  Filled 2017-12-21: qty 2

## 2017-12-21 MED ORDER — MIDAZOLAM HCL 2 MG/2ML IJ SOLN
INTRAMUSCULAR | Status: AC
  Filled 2017-12-21: qty 2

## 2017-12-21 MED ORDER — PROPOFOL 10 MG/ML IV BOLUS
INTRAVENOUS | Status: AC
Start: 2017-12-21 — End: 2017-12-21
  Filled 2017-12-21: qty 20

## 2017-12-21 MED ORDER — ONDANSETRON HCL 4 MG/2ML IJ SOLN
INTRAMUSCULAR | Status: DC | PRN
  Administered 2017-12-21: 4 mg via INTRAVENOUS

## 2017-12-21 MED ORDER — DEXAMETHASONE SODIUM PHOSPHATE 10 MG/ML IJ SOLN
INTRAMUSCULAR | Status: AC
  Filled 2017-12-21: qty 1

## 2017-12-21 MED ORDER — FENTANYL CITRATE (PF) 250 MCG/5ML IJ SOLN
INTRAMUSCULAR | Status: AC
Start: 2017-12-21 — End: 2017-12-21
  Filled 2017-12-21: qty 5

## 2017-12-21 MED ORDER — LACTATED RINGERS IV SOLN
INTRAVENOUS | Status: DC | PRN
  Administered 2017-12-21: 10:00:00 via INTRAVENOUS

## 2017-12-21 MED ORDER — ROCURONIUM BROMIDE 10 MG/ML (PF) SYRINGE
PREFILLED_SYRINGE | INTRAVENOUS | Status: DC | PRN
  Administered 2017-12-21: 40 mg via INTRAVENOUS

## 2017-12-21 MED ORDER — 0.9 % SODIUM CHLORIDE (POUR BTL) OPTIME
TOPICAL | Status: DC | PRN
  Administered 2017-12-21: 1000 mL

## 2017-12-21 MED ORDER — LIDOCAINE HCL (CARDIAC) 20 MG/ML IV SOLN
INTRAVENOUS | Status: AC
  Filled 2017-12-21: qty 5

## 2017-12-21 MED ORDER — LIDOCAINE-EPINEPHRINE 1 %-1:100000 IJ SOLN
INTRAMUSCULAR | Status: DC | PRN
  Administered 2017-12-21: 5 mL

## 2017-12-21 MED ORDER — FENTANYL CITRATE (PF) 250 MCG/5ML IJ SOLN
INTRAMUSCULAR | Status: DC | PRN
  Administered 2017-12-21 (×2): 50 ug via INTRAVENOUS

## 2017-12-21 MED ORDER — PHENYLEPHRINE 40 MCG/ML (10ML) SYRINGE FOR IV PUSH (FOR BLOOD PRESSURE SUPPORT)
PREFILLED_SYRINGE | INTRAVENOUS | Status: DC | PRN
  Administered 2017-12-21: 120 ug via INTRAVENOUS
  Administered 2017-12-21: 80 ug via INTRAVENOUS
  Administered 2017-12-21 (×2): 160 ug via INTRAVENOUS
  Administered 2017-12-21 (×2): 120 ug via INTRAVENOUS
  Administered 2017-12-21 (×2): 160 ug via INTRAVENOUS

## 2017-12-21 SURGICAL SUPPLY — 39 items
ATTRACTOMAT 16X20 MAGNETIC DRP (DRAPES) IMPLANT
BLADE SURG 15 STRL LF DISP TIS (BLADE) ×1 IMPLANT
BLADE SURG 15 STRL SS (BLADE) ×2
CLEANER TIP ELECTROSURG 2X2 (MISCELLANEOUS) ×3 IMPLANT
COVER SURGICAL LIGHT HANDLE (MISCELLANEOUS) ×3 IMPLANT
DRAPE HALF SHEET 40X57 (DRAPES) IMPLANT
ELECT COATED BLADE 2.86 ST (ELECTRODE) ×3 IMPLANT
ELECT REM PT RETURN 9FT ADLT (ELECTROSURGICAL) ×3
ELECTRODE REM PT RTRN 9FT ADLT (ELECTROSURGICAL) ×1 IMPLANT
GAUZE SPONGE 4X4 16PLY XRAY LF (GAUZE/BANDAGES/DRESSINGS) ×3 IMPLANT
GEL ULTRASOUND 20GR AQUASONIC (MISCELLANEOUS) ×3 IMPLANT
GLOVE SS BIOGEL STRL SZ 7.5 (GLOVE) ×1 IMPLANT
GLOVE SUPERSENSE BIOGEL SZ 7.5 (GLOVE) ×2
GOWN STRL REUS W/ TWL LRG LVL3 (GOWN DISPOSABLE) ×1 IMPLANT
GOWN STRL REUS W/ TWL XL LVL3 (GOWN DISPOSABLE) ×1 IMPLANT
GOWN STRL REUS W/TWL LRG LVL3 (GOWN DISPOSABLE) ×2
GOWN STRL REUS W/TWL XL LVL3 (GOWN DISPOSABLE) ×2
HOLDER TRACH TUBE VELCRO 19.5 (MISCELLANEOUS) ×3 IMPLANT
KIT BASIN OR (CUSTOM PROCEDURE TRAY) ×3 IMPLANT
KIT SUCTION CATH 14FR (SUCTIONS) ×3 IMPLANT
KIT TURNOVER KIT B (KITS) ×3 IMPLANT
NDL HYPO 25GX1X1/2 BEV (NEEDLE) ×1 IMPLANT
NEEDLE HYPO 25GX1X1/2 BEV (NEEDLE) ×3 IMPLANT
NS IRRIG 1000ML POUR BTL (IV SOLUTION) ×3 IMPLANT
PACK EENT II TURBAN DRAPE (CUSTOM PROCEDURE TRAY) ×3 IMPLANT
PAD ARMBOARD 7.5X6 YLW CONV (MISCELLANEOUS) IMPLANT
PENCIL BUTTON HOLSTER BLD 10FT (ELECTRODE) ×3 IMPLANT
SPONGE DRAIN TRACH 4X4 STRL 2S (GAUZE/BANDAGES/DRESSINGS) ×3 IMPLANT
SPONGE INTESTINAL PEANUT (DISPOSABLE) ×3 IMPLANT
SUT SILK 2 0 SH CR/8 (SUTURE) ×3 IMPLANT
SUT SILK 3 0 TIES 10X30 (SUTURE) ×2 IMPLANT
SYR 5ML LUER SLIP (SYRINGE) ×3 IMPLANT
SYR CONTROL 10ML LL (SYRINGE) ×3 IMPLANT
TOWEL OR 17X24 6PK STRL BLUE (TOWEL DISPOSABLE) ×3 IMPLANT
TOWEL OR 17X26 10 PK STRL BLUE (TOWEL DISPOSABLE) ×3 IMPLANT
TUBE CONNECTING 12'X1/4 (SUCTIONS) ×1
TUBE CONNECTING 12X1/4 (SUCTIONS) ×2 IMPLANT
TUBE TRACH SHILEY  6 DIST  CUF (TUBING) ×2 IMPLANT
TUBE TRACH SHILEY 8 DIST CUF (TUBING) IMPLANT

## 2017-12-21 NOTE — Progress Notes (Signed)
Pulmonary Critical Care Medicine [facility] [place]   PULMONARY SERVICE  PROGRESS NOTE  Date of Service:  December 21, 2017  Rodney Cordova  WUJ:[id number]  DOB: 1940-11-14   DOA: 12/16/2017  Referring Physician: Carron Curie, MD  HPI: Rodney Cordova is a 77 y.o. male seen for follow up of Acute on Chronic Respiratory Failure.  Remains on the ventilator did pressure support yesterday for about 8 hr patient is scheduled for tracheostomy to be done today  Medications: Reviewed on Rounds  Physical Exam:  Vitals:  Temperature 98.6 degrees pulse 111 respiratory 33 blood pressure 125/68 saturations 97%  Ventilator Settings  Mode of ventilation assist-control FiO2 30% tidal volume 460 peep 5  . General: Comfortable at this time . Eyes: Grossly normal lids, irises & conjunctiva . ENT: grossly tongue is normal . Neck: no obvious mass . Cardiovascular:  S1-S2 normal no gallop . Respiratory:   No rhonchi noted . Abdomen:  Soft nontender . Skin: no rash seen on limited exam . Musculoskeletal: not rigid . Psychiatric:unable to assess . Neurologic: no seizure no involuntary movements         Labs on Admission:  Basic Metabolic Panel: Recent Labs  Lab 12/17/17 0739 12/20/17 0130  NA 144 146*  K 4.2 3.5  CL 112* 114*  CO2 24 23  GLUCOSE 130* 131*  BUN 92* 98*  CREATININE 2.86* 3.07*  CALCIUM 8.2* 7.9*    Liver Function Tests: Recent Labs  Lab 12/17/17 0739  AST 89*  ALT 97*  ALKPHOS 105  BILITOT 0.4  PROT 6.8  ALBUMIN 1.4*   No results for input(s): LIPASE, AMYLASE in the last 168 hours. No results for input(s): AMMONIA in the last 168 hours.  CBC: Recent Labs  Lab 12/16/17 1603 12/17/17 0739 12/20/17 0130  WBC 10.9* 10.9* 5.9  NEUTROABS 8.0*  --   --   HGB 8.3* 7.6* 7.8*  HCT 28.2* 24.8* 26.3*  MCV 100.7* 98.8 96.3  PLT 202 205 210    Cardiac Enzymes: No results for input(s): CKTOTAL, CKMB, CKMBINDEX, TROPONINI in the last 168 hours.  BNP  (last 3 results) No results for input(s): BNP in the last 8760 hours.  ProBNP (last 3 results) No results for input(s): PROBNP in the last 8760 hours.  Radiological Exams on Admission: Dg Abd Portable 1v  Result Date: 12/21/2017 CLINICAL DATA:  Status post orogastric tube placement. EXAM: PORTABLE ABDOMEN - 1 VIEW COMPARISON:  Abdominal radiograph of March 23rd 2019 FINDINGS: The nasogastric tubes proximal port lies just above the GE junction with the tip of the tube just inside the gastric cardia. There is a small amount of distended small bowel to the left of midline. There is an inferior vena caval filter present. There is a drainage tube present peripherally in the left upper quadrant. There surgical clips in the gallbladder fossa. There is a compression fracture of the body of L1 which is not new. IMPRESSION: Advancement of the nasogastric tube by 10-15 cm is recommended to assure that both the tip and proximal port lie below the GE junction. Electronically Signed   By: David  [place] M.D.   On: 12/21/2017 13:57    Assessment/Plan Active Problems:   Pulmonary emboli (HCC)   Acute DVT (deep venous thrombosis) (HCC)   Postoperative intra-abdominal abscess   Acute on chronic respiratory failure with hypoxia (HCC)   Acute cholecystitis   Lobar pneumonia (HCC)   Essential hypertension   1.  acute on chronic Respiratory failure with  hypoxia continue with full support on the ventilator for now until after the procedure is done once a procedures done will resume weaning. 2. Lobar pneumonia treated with antibiotics we will continue to monitor closely 3. Pulmonary embolism stable at this time 4. Postoperative intra-abdominal abscess continue with supportive care   I have personally seen and evaluated the patient, evaluated laboratory and imaging results, formulated the assessment and plan and placed orders. The Patient requires high complexity decision making for assessment and support.   Case was discussed on Rounds with the Respiratory Therapy Staff  Yevonne PaxSaadat A Gemma Ruan, MD [facility] Pulmonary Critical Care Medicine Sleep Medicine

## 2017-12-21 NOTE — H&P (Signed)
PREOPERATIVE H&P  Chief Complaint: Respiratory failure  HPI: Rodney Cordova is a 77 y.o. male who presents for evaluation of respiratory failure. Patient is 6 wks s/p lap chole for gangrenous chlecystitis that was complicated by intraabdominal abscess and DVT. Patient  Had IVC filter placed and required hospitalization. He had respiratory problems requiring intubation on 3/15. He was also noted to have encephalopathy with mental status changes. He was subsequently [facility] to [facility]. He has a previous tracheotomy performed 18 years ago during a hospitalization. They have been unable to wean him on the ventilator and I was consulted about placing a trach. He's presently on heparin which has been held for the past 14 hrs and ASA.  His last HGB was 7.8 but he's had no bleeding problems the last 48 hrs.   Past Medical History:  Diagnosis Date  . Acute cholecystitis 12/18/2017  . Acute DVT (deep venous thrombosis) (HCC) 12/18/2017  . Acute on chronic respiratory failure with hypoxia (HCC) 12/18/2017  . Lobar pneumonia (HCC) 12/18/2017  . Postoperative intra-abdominal abscess 12/18/2017  . Pulmonary emboli (HCC) 12/18/2017   Past Surgical History:  Procedure Laterality Date  . CHOLECYSTECTOMY     Social History   Socioeconomic History  . Marital status: Married    Spouse name: Not on file  . Number of children: Not on file  . Years of education: Not on file  . Highest education level: Not on file  Occupational History  . Not on file  Social Needs  . Financial resource strain: Not on file  . Food insecurity:    Worry: Not on file    Inability: Not on file  . Transportation needs:    Medical: Not on file    Non-medical: Not on file  Tobacco Use  . Smoking status: Smoker, Current Status Unknown  . Smokeless tobacco: Never Used  Substance and Sexual Activity  . Alcohol use: Not Currently  . Drug use: Not Currently  . Sexual activity: Not Currently  Lifestyle  . Physical  activity:    Days per week: Not on file    Minutes per session: Not on file  . Stress: Not on file  Relationships  . Social connections:    Talks on phone: Not on file    Gets together: Not on file    Attends religious service: Not on file    Active member of club or organization: Not on file    Attends meetings of clubs or organizations: Not on file    Relationship status: Not on file  Other Topics Concern  . Not on file  Social History Narrative  . Not on file   Family History  Family history unknown: Yes   Allergies not on file Prior to Admission medications   Not on File     Positive ROS: negative  All other systems have been reviewed and were otherwise negative with the exception of those mentioned in the HPI and as above.  Physical Exam: There were no vitals filed for this visit.  General: Not responsive and intubated. Discussed trach with wife at bedside. Nasal: Clear nasal passages Neck: No palpable adenopathy. No masses. Trach midline with old trach scar. Cardiovascular: Regular rate and rhythm, no murmur.  Respiratory: Clear to auscultation   Assessment/Plan: RESPIRATORY FAILURE Plan for Procedure(s): TRACHEOSTOMY   Dillard Cannonhristopher Taylyn Brame, MD 12/21/2017 10:08 AM

## 2017-12-21 NOTE — Interval H&P Note (Signed)
History and Physical Interval Note:  12/21/2017 10:25 AM  Rodney Cordova  has presented today for surgery, with the diagnosis of RESPIRATORY FAILURE  The various methods of treatment have been discussed with the patient and family. After consideration of risks, benefits and other options for treatment, the patient has consented to  Procedure(s): TRACHEOSTOMY (N/A) as a surgical intervention .  The patient's history has been reviewed, patient examined, no change in status, stable for surgery.  I have reviewed the patient's chart and labs.  Questions were answered to the patient's satisfaction.     Dillard Cannonhristopher Newman

## 2017-12-21 NOTE — Op Note (Signed)
NAME:  Rodney MainsPPERSON, Rodney Cordova                   ACCOUNT NO.:  MEDICAL RECORD NO.:  [id number]  LOCATION:                                 FACILITY:  PHYSICIAN:  Kristine GarbeChristopher E. Ezzard StandingNewman, M.D. DATE OF BIRTH:  DATE OF PROCEDURE:  12/21/2017 DATE OF DISCHARGE:                              OPERATIVE REPORT   PREOPERATIVE DIAGNOSIS:  Acute on chronic respiratory failure.  POSTOPERATIVE DIAGNOSIS:  Acute on chronic respiratory failure.  OPERATION PERFORMED:  Tracheotomy with a #6 cuffed Shiley tracheostomy tube.  SURGEON:  Kristine GarbeChristopher E. Ezzard StandingNewman, M.D.  ANESTHESIA:  General endotracheal.  COMPLICATIONS:  None.  BLOOD LOSS:  Minimal.  BRIEF CLINICAL NOTE:  Rodney Cordova is a 77 year old gentleman who has had recent history of cholecystectomy for gangrenous gallbladder 6 weeks ago.  This was complicated by intraabdominal abscess as well as some bleeding from his abdominal wall.  [facility] had to [facility] where he had respiratory problems as well as DVTs and a pulmonary embolus.  He was subsequently intubated about 2 weeks ago.  He has encephalopathic changes with mental status changes. The patient was subsequently [facility] ago, intubated on a ventilator.  Attempts at weaning the patient were unsuccessful and Pulmonary Medicine recommended the patient undergo a tracheotomy.  The patient had a previous tracheotomy performed over 18 years ago during a complicated hospitalization at that time.  [facility] is [facility] for placement of tracheotomy.  DESCRIPTION OF PROCEDURE:  The [facility] was [facility], remained in his bed.  His moles were placed under shoulders to extend his neck.  Previous tracheostomy scar was easily visualized.  Neck was prepped with Betadine solution and draped out with sterile towels.  The area of proposed trach was  injected with 4-5 mL of Xylocaine with epinephrine for hemostasis.  A vertical incision was made through the previous trach scar just above the suprasternal notch. Dissection was carried down through the subcutaneous tissue with cautery.  Strap muscles were identified and retracted laterally. Followed the scar tissue down to the trachea.  The previous tracheotomy was performed just below the cricoid cartilage.  A cricoid hook was used to suspend the cricoid cartilage and a horizontal tracheotomy was performed between the 1st and 2nd tracheal rings below the cricoid cartilage.  Endotracheal tube was removed and a #6 cuffed Shiley tube was inserted without difficulty.  The patient was ventilated well.  This was secured to the neck with 2-0 silk sutures x4 followed by a Velcro trach collar.  The patient had no significant bleeding.  The patient was [facility].          ______________________________ Kristine Garbehristopher E. Ezzard StandingNewman, M.D.     CEN/MEDQ  D:  12/21/2017  T:  12/21/2017  Job:  [id number]

## 2017-12-21 NOTE — Anesthesia Postprocedure Evaluation (Signed)
Anesthesia Post Note  Patient: Rodney Cordova  Procedure(s) Performed: TRACHEOSTOMY (N/A Neck)     Patient location during evaluation: ICU Anesthesia Type: General Level of consciousness: sedated Pain management: pain level controlled Vital Signs Assessment: post-procedure vital signs reviewed and stable Respiratory status: patient on ventilator - see flowsheet for VS (Patient ventilating via new tracheostomy) Cardiovascular status: stable Postop Assessment: no apparent nausea or vomiting Anesthetic complications: no    Last Vitals: There were no vitals filed for this visit.  Last Pain: There were no vitals filed for this visit.               Beryle Lathehomas E Brock

## 2017-12-21 NOTE — Transfer of Care (Signed)
Immediate Anesthesia Transfer of Care Note  Patient: Rodney Cordova  Procedure(s) Performed: TRACHEOSTOMY (N/A Neck)  Patient Location: [facility]  Anesthesia Type:General  Level of Consciousness: sedated  Airway & Oxygen Therapy: Patient Spontanous Breathing and Patient remains intubated per anesthesia plan  Post-op Assessment: Report given to RN and Post -op Vital signs reviewed and stable  Post vital signs: Reviewed and stable  Last Vitals:  Vitals Value Taken Time  BP    Temp    Pulse    Resp    SpO2      Last Pain: There were no vitals filed for this visit.       Complications: No apparent anesthesia complications

## 2017-12-21 NOTE — Brief Op Note (Signed)
12/21/2017  11:07 AM  PATIENT:  Rodney Cordova  77 y.o. male  PRE-OPERATIVE DIAGNOSIS:  RESPIRATORY FAILURE  POST-OPERATIVE DIAGNOSIS:  RESPIRATORY FAILURE  PROCEDURE:  Procedure(s): TRACHEOSTOMY (N/A)  #6 Cuffed Siley  SURGEON:  Surgeon(s) and Role:    Drema Halon* Newman, Christopher E, MD - Primary  PHYSICIAN ASSISTANT:   ASSISTANTS: none   ANESTHESIA:   general  EBL:  5 mL   BLOOD ADMINISTERED:none  DRAINS: none   LOCAL MEDICATIONS USED:  XYLOCAINE with EPI 5 cc  SPECIMEN:  No Specimen  DISPOSITION OF SPECIMEN:  N/A  COUNTS:  YES  TOURNIQUET:  * No tourniquets in log *  DICTATION: .Other Dictation: Dictation Number [id number]  PLAN OF CARE: Discharge to home after PACU  PATIENT DISPOSITION:  PACU - hemodynamically stable.   Delay start of Pharmacological VTE agent (>24hrs) due to surgical blood loss or risk of bleeding: yes

## 2017-12-22 ENCOUNTER — Encounter (HOSPITAL_COMMUNITY): Payer: Self-pay | Admitting: Otolaryngology

## 2017-12-22 DIAGNOSIS — J181 Lobar pneumonia, unspecified organism: Secondary | ICD-10-CM | POA: Diagnosis not present

## 2017-12-22 DIAGNOSIS — I2699 Other pulmonary embolism without acute cor pulmonale: Secondary | ICD-10-CM | POA: Diagnosis not present

## 2017-12-22 DIAGNOSIS — T8149XA Infection following a procedure, other surgical site, initial encounter: Secondary | ICD-10-CM | POA: Diagnosis not present

## 2017-12-22 DIAGNOSIS — J9621 Acute and chronic respiratory failure with hypoxia: Secondary | ICD-10-CM | POA: Diagnosis not present

## 2017-12-22 LAB — HEPARIN LEVEL (UNFRACTIONATED)
HEPARIN UNFRACTIONATED: 0.26 [IU]/mL — AB (ref 0.30–0.70)
Heparin Unfractionated: 0.31 IU/mL (ref 0.30–0.70)
Heparin Unfractionated: 0.27 IU/mL — ABNORMAL LOW (ref 0.30–0.70)

## 2017-12-22 NOTE — Progress Notes (Signed)
Pulmonary Critical Care Medicine [facility] [place]   PULMONARY SERVICE  PROGRESS NOTE  Date of Service: December 22, 2017  Rodney Cordova  ZOX:[id number]  DOB: [birth year]   DOA: 12/16/2017  Referring Physician: Carron Curie, MD  HPI: Rodney Cordova is a 77 y.o. male seen for follow up of Acute on Chronic Respiratory Failure.  Patient is on pressure support mode patient had a tracheostomy done doing well so far.  Has been tolerating the pressure support wean fairly well also.  Patient's family was in the room they were updated  Medications: Reviewed on Rounds  Physical Exam:  Vitals: Temperature 99.4 pulse 95 respiratory rate 30 blood pressure 142/74 saturation 95%  Ventilator Settings mode of ventilation pressure support FiO2 35% tidal volume 300 cc pressure support level is 12 PEEP is 5  . General: Comfortable at this time . Eyes: Grossly normal lids, irises & conjunctiva . ENT: grossly tongue is normal . Neck: no obvious mass . Cardiovascular: Regular rhythm no gallop . Respiratory: No rhonchi expansion equal . Abdomen: Soft nontender . Skin: no rash seen on limited exam . Musculoskeletal: not rigid . Psychiatric:unable to assess . Neurologic: no seizure no involuntary movements         Labs on Admission:  Basic Metabolic Panel: Recent Labs  Lab 12/17/17 0739 12/20/17 0130  NA 144 146*  K 4.2 3.5  CL 112* 114*  CO2 24 23  GLUCOSE 130* 131*  BUN 92* 98*  CREATININE 2.86* 3.07*  CALCIUM 8.2* 7.9*    Liver Function Tests: Recent Labs  Lab 12/17/17 0739  AST 89*  ALT 97*  ALKPHOS 105  BILITOT 0.4  PROT 6.8  ALBUMIN 1.4*   No results for input(s): LIPASE, AMYLASE in the last 168 hours. No results for input(s): AMMONIA in the last 168 hours.  CBC: Recent Labs  Lab 12/16/17 1603 12/17/17 0739 12/20/17 0130  WBC 10.9* 10.9* 5.9  NEUTROABS 8.0*  --   --   HGB 8.3* 7.6* 7.8*  HCT 28.2* 24.8* 26.3*  MCV 100.7* 98.8 96.3  PLT 202 205 210     Cardiac Enzymes: No results for input(s): CKTOTAL, CKMB, CKMBINDEX, TROPONINI in the last 168 hours.  BNP (last 3 results) No results for input(s): BNP in the last 8760 hours.  ProBNP (last 3 results) No results for input(s): PROBNP in the last 8760 hours.  Radiological Exams on Admission: Dg Abd Portable 1v  Result Date: 12/21/2017 CLINICAL DATA:  Status post orogastric tube placement. EXAM: PORTABLE ABDOMEN - 1 VIEW COMPARISON:  Abdominal radiograph of March 23rd 2019 FINDINGS: The nasogastric tubes proximal port lies just above the GE junction with the tip of the tube just inside the gastric cardia. There is a small amount of distended small bowel to the left of midline. There is an inferior vena caval filter present. There is a drainage tube present peripherally in the left upper quadrant. There surgical clips in the gallbladder fossa. There is a compression fracture of the body of L1 which is not new. IMPRESSION: Advancement of the nasogastric tube by 10-15 cm is recommended to assure that both the tip and proximal port lie below the GE junction. Electronically Signed   By: David  [place] M.D.   On: 12/21/2017 13:57    Assessment/Plan Active Problems:   Pulmonary emboli (HCC)   Acute DVT (deep venous thrombosis) (HCC)   Postoperative intra-abdominal abscess   Acute on chronic respiratory failure with hypoxia (HCC)   Acute cholecystitis  Lobar pneumonia (HCC)   Essential hypertension   1. Acute on chronic respiratory failure with hypoxia we will continue with weaning pressure support as tolerated discussed on rounds with respiratory therapy to continue to advance 2. Pulmonary embolism treated we will continue to monitor 3. Lobar pneumonia treated we will continue to monitor 4. Postoperative intra-abdominal abscess has been treated we will continue to follow along   I have personally seen and evaluated the patient, evaluated laboratory and imaging results, formulated the  assessment and plan and placed orders. The Patient requires high complexity decision making for assessment and support.  Case was discussed on Rounds with the Respiratory Therapy Staff  Rodney PaxSaadat A Braylea Brancato, MD [facility] Pulmonary Critical Care Medicine Sleep Medicine

## 2017-12-23 LAB — BASIC METABOLIC PANEL
Anion gap: 5 (ref 5–15)
BUN: 91 mg/dL — AB (ref 6–20)
CHLORIDE: 126 mmol/L — AB (ref 101–111)
CO2: 26 mmol/L (ref 22–32)
Calcium: 7.8 mg/dL — ABNORMAL LOW (ref 8.9–10.3)
Creatinine, Ser: 2.79 mg/dL — ABNORMAL HIGH (ref 0.61–1.24)
GFR, EST AFRICAN AMERICAN: 24 mL/min — AB (ref >60)
GFR, EST NON AFRICAN AMERICAN: 21 mL/min — AB (ref >60)
Glucose, Bld: 165 mg/dL — ABNORMAL HIGH (ref 65–99)
Potassium: 3.4 mmol/L — ABNORMAL LOW (ref 3.5–5.1)
SODIUM: 157 mmol/L — AB (ref 135–145)

## 2017-12-23 LAB — CBC
HCT: 27.1 % — ABNORMAL LOW (ref 39.0–52.0)
Hemoglobin: 7.4 g/dL — ABNORMAL LOW (ref 13.0–17.0)
MCH: 28.1 pg (ref 26.0–34.0)
MCHC: 27.3 g/dL — ABNORMAL LOW (ref 30.0–36.0)
MCV: 103 fL — AB (ref 78.0–100.0)
PLATELETS: 191 10*3/uL (ref 150–400)
RBC: 2.63 MIL/uL — AB (ref 4.22–5.81)
RDW: 17.3 % — AB (ref 11.5–15.5)
WBC: 8.1 10*3/uL (ref 4.0–10.5)

## 2017-12-23 LAB — HEPARIN LEVEL (UNFRACTIONATED)
HEPARIN UNFRACTIONATED: 0.38 [IU]/mL (ref 0.30–0.70)
Heparin Unfractionated: 0.29 IU/mL — ABNORMAL LOW (ref 0.30–0.70)

## 2017-12-24 DIAGNOSIS — T8149XA Infection following a procedure, other surgical site, initial encounter: Secondary | ICD-10-CM | POA: Diagnosis not present

## 2017-12-24 DIAGNOSIS — J9621 Acute and chronic respiratory failure with hypoxia: Secondary | ICD-10-CM | POA: Diagnosis not present

## 2017-12-24 DIAGNOSIS — I2699 Other pulmonary embolism without acute cor pulmonale: Secondary | ICD-10-CM | POA: Diagnosis not present

## 2017-12-24 DIAGNOSIS — J181 Lobar pneumonia, unspecified organism: Secondary | ICD-10-CM | POA: Diagnosis not present

## 2017-12-24 LAB — HEPARIN LEVEL (UNFRACTIONATED): HEPARIN UNFRACTIONATED: 0.3 [IU]/mL (ref 0.30–0.70)

## 2017-12-24 NOTE — Progress Notes (Signed)
Pulmonary Critical Care Medicine [facility] [place]   PULMONARY SERVICE  PROGRESS NOTE  Date of Service: December 24, 2017  Rodney MainsGrier Cordova  WUJ:[id number]  DOB: 03/19/1941   DOA: 12/16/2017  Referring Physician: Carron CurieAli Hijazi, MD  HPI: Rodney Cordova is a 77 y.o. male seen for follow up of Acute on Chronic Respiratory Failure.  Patient has been tolerating weaning on pressure support the goal was for about 16 hours.  Looks good so far has been on 35% FiO2  Medications: Reviewed on Rounds  Physical Exam:  Vitals: Temperature 99.5 pulse 94 respiratory 25 blood pressure 132/76 saturation 98%  Ventilator Settings mode of ventilation pressure support FiO2 35% tidal volume 600 cc pressure support 12 PEEP 5  . General: Comfortable at this time . Eyes: Grossly normal lids, irises & conjunctiva . ENT: grossly tongue is normal . Neck: no obvious mass . Cardiovascular: S1-S2 normal no gallop . Respiratory: No rhonchi expansion equal . Abdomen: Soft nontender . Skin: no rash seen on limited exam . Musculoskeletal: not rigid . Psychiatric:unable to assess . Neurologic: no seizure no involuntary movements         Labs on Admission:  Basic Metabolic Panel: Recent Labs  Lab 12/20/17 0130 12/23/17 0523  NA 146* 157*  K 3.5 3.4*  CL 114* 126*  CO2 23 26  GLUCOSE 131* 165*  BUN 98* 91*  CREATININE 3.07* 2.79*  CALCIUM 7.9* 7.8*    Liver Function Tests: No results for input(s): AST, ALT, ALKPHOS, BILITOT, PROT, ALBUMIN in the last 168 hours. No results for input(s): LIPASE, AMYLASE in the last 168 hours. No results for input(s): AMMONIA in the last 168 hours.  CBC: Recent Labs  Lab 12/20/17 0130 12/23/17 0523  WBC 5.9 8.1  HGB 7.8* 7.4*  HCT 26.3* 27.1*  MCV 96.3 103.0*  PLT 210 191    Cardiac Enzymes: No results for input(s): CKTOTAL, CKMB, CKMBINDEX, TROPONINI in the last 168 hours.  BNP (last 3 results) No results for input(s): BNP in the last 8760  hours.  ProBNP (last 3 results) No results for input(s): PROBNP in the last 8760 hours.  Radiological Exams on Admission: Dg Abd Portable 1v  Result Date: 12/21/2017 CLINICAL DATA:  Status post orogastric tube placement. EXAM: PORTABLE ABDOMEN - 1 VIEW COMPARISON:  Abdominal radiograph of March 23rd 2019 FINDINGS: The nasogastric tubes proximal port lies just above the GE junction with the tip of the tube just inside the gastric cardia. There is a small amount of distended small bowel to the left of midline. There is an inferior vena caval filter present. There is a drainage tube present peripherally in the left upper quadrant. There surgical clips in the gallbladder fossa. There is a compression fracture of the body of L1 which is not new. IMPRESSION: Advancement of the nasogastric tube by 10-15 cm is recommended to assure that both the tip and proximal port lie below the GE junction. Electronically Signed   By: David  SwazilandJordan M.D.   On: 12/21/2017 13:57    Assessment/Plan Active Problems:   Pulmonary emboli (HCC)   Acute DVT (deep venous thrombosis) (HCC)   Postoperative intra-abdominal abscess   Acute on chronic respiratory failure with hypoxia (HCC)   Acute cholecystitis   Lobar pneumonia (HCC)   Essential hypertension   1. Acute on chronic restorative failure with hypoxia patient is doing fine with weaning the goal was for about 16 hours maximum today 2. Pulmonary emboli treated we will continue to monitor 3.  Lobar pneumonia treated we will continue to follow 4. Postoperative intra-abdominal abscess treated   I have personally seen and evaluated the patient, evaluated laboratory and imaging results, formulated the assessment and plan and placed orders. The Patient requires high complexity decision making for assessment and support.  Case was discussed on Rounds with the Respiratory Therapy Staff  Yevonne Pax, MD [facility] Pulmonary Critical Care Medicine Sleep Medicine

## 2017-12-25 DIAGNOSIS — J181 Lobar pneumonia, unspecified organism: Secondary | ICD-10-CM | POA: Diagnosis not present

## 2017-12-25 DIAGNOSIS — J9621 Acute and chronic respiratory failure with hypoxia: Secondary | ICD-10-CM | POA: Diagnosis not present

## 2017-12-25 DIAGNOSIS — I2699 Other pulmonary embolism without acute cor pulmonale: Secondary | ICD-10-CM | POA: Diagnosis not present

## 2017-12-25 DIAGNOSIS — T8149XA Infection following a procedure, other surgical site, initial encounter: Secondary | ICD-10-CM | POA: Diagnosis not present

## 2017-12-25 LAB — TYPE AND SCREEN
ABO/RH(D): O POS
Antibody Screen: NEGATIVE
Unit division: 0

## 2017-12-25 LAB — HEPARIN LEVEL (UNFRACTIONATED)
HEPARIN UNFRACTIONATED: 0.4 [IU]/mL (ref 0.30–0.70)
Heparin Unfractionated: 0.22 IU/mL — ABNORMAL LOW (ref 0.30–0.70)

## 2017-12-25 LAB — BPAM RBC
Blood Product Expiration Date: 201904242359
Unit Type and Rh: 5100

## 2017-12-25 NOTE — Progress Notes (Signed)
[facility] [place]   PULMONARY SERVICE  PROGRESS NOTE  Date of Service:  December 25, 2017  Rodney Cordova  UJW:[id number]  DOB: 02/12/1941   DOA: 12/16/2017  Referring Physician: Carron CurieAli Hijazi, MD  HPI: Rodney Cordova is a 77 y.o. male seen for follow up of Acute on Chronic Respiratory Failure.  Patient is weaning on pressure support has been doing very well.  Has had good tidal volumes noted.  Currently is on pressure support 12 peep 5 has been tolerating this quite well  Medications: Reviewed on Rounds  Physical Exam:  Vitals:  Temperature 99.1 degrees pulse 102 respiratory 25 blood pressure 135/65 saturations 96%  Ventilator Settings  Mode of ventilation pressure support FiO2 35% tidal volume 600 pressure support 12 peep 5  . General: Comfortable at this time . Eyes: Grossly normal lids, irises & conjunctiva . ENT: grossly tongue is normal . Neck: no obvious mass . Cardiovascular:  S1-S2 normal no gallop rub . Respiratory:  No rhonchi expansion equal . Abdomen:  Soft nontender . Skin: no rash seen on limited exam . Musculoskeletal: not rigid . Psychiatric:unable to assess . Neurologic: no seizure no involuntary movements         Labs on Admission:  Basic Metabolic Panel: Recent Labs  Lab 12/20/17 0130 12/23/17 0523  NA 146* 157*  K 3.5 3.4*  CL 114* 126*  CO2 23 26  GLUCOSE 131* 165*  BUN 98* 91*  CREATININE 3.07* 2.79*  CALCIUM 7.9* 7.8*    Liver Function Tests: No results for input(s): AST, ALT, ALKPHOS, BILITOT, PROT, ALBUMIN in the last 168 hours. No results for input(s): LIPASE, AMYLASE in the last 168 hours. No results for input(s): AMMONIA in the last 168 hours.  CBC: Recent Labs  Lab 12/20/17 0130 12/23/17 0523  WBC 5.9 8.1  HGB 7.8* 7.4*  HCT 26.3* 27.1*  MCV 96.3 103.0*  PLT 210 191    Cardiac Enzymes: No results for input(s): CKTOTAL, CKMB, CKMBINDEX, TROPONINI in the last 168 hours.  BNP  (last 3 results) No results for input(s): BNP in the last 8760 hours.  ProBNP (last 3 results) No results for input(s): PROBNP in the last 8760 hours.  Radiological Exams on Admission: No results found.  Assessment/Plan Active Problems:   Pulmonary emboli (HCC)   Acute DVT (deep venous thrombosis) (HCC)   Postoperative intra-abdominal abscess   Acute on chronic respiratory failure with hypoxia (HCC)   Acute cholecystitis   Lobar pneumonia (HCC)   Essential hypertension   1.  acute on chronic Respiratory failure with hypoxia patient has been doing well on pressure support wean with excellent tidal volumes.  Had a good discussion on rounds we going to go ahead start advancing on aerosolized T-piece as tolerated.  In addition patient did have a history of sleep apnea he will need a CPAP and will get this also started on prior to considering decannulation 2. Postoperative intra-abdominal abscess at baseline improved will continue with supportive care 3. Lobar pneumonia treated with antibiotics clinically much improved will continue to monitor periodic x-rays as necessary  4.  pulmonary embolism Acute DVT treated appropriately will continue   I have personally seen and evaluated the patient, evaluated laboratory and imaging results, formulated the assessment and plan and placed orders. The Patient requires high complexity decision making for assessment and support.  Case was discussed on Rounds with the Respiratory Therapy Staff  Yevonne PaxSaadat A Jana Swartzlander, MD [facility] Pulmonary Critical Care Medicine Sleep  Medicine

## 2017-12-26 ENCOUNTER — Other Ambulatory Visit (HOSPITAL_COMMUNITY): Payer: Medicare Other

## 2017-12-26 DIAGNOSIS — T8149XA Infection following a procedure, other surgical site, initial encounter: Secondary | ICD-10-CM | POA: Diagnosis not present

## 2017-12-26 DIAGNOSIS — I2699 Other pulmonary embolism without acute cor pulmonale: Secondary | ICD-10-CM | POA: Diagnosis not present

## 2017-12-26 DIAGNOSIS — J181 Lobar pneumonia, unspecified organism: Secondary | ICD-10-CM | POA: Diagnosis not present

## 2017-12-26 DIAGNOSIS — J9621 Acute and chronic respiratory failure with hypoxia: Secondary | ICD-10-CM | POA: Diagnosis not present

## 2017-12-26 LAB — BLOOD GAS, ARTERIAL
ACID-BASE EXCESS: 0.7 mmol/L (ref 0.0–2.0)
BICARBONATE: 26.6 mmol/L (ref 20.0–28.0)
FIO2: 40
O2 Saturation: 95.2 %
PCO2 ART: 57.6 mmHg — AB (ref 32.0–48.0)
PH ART: 7.287 — AB (ref 7.350–7.450)
PO2 ART: 77.7 mmHg — AB (ref 83.0–108.0)
Patient temperature: 98.6

## 2017-12-26 LAB — HEPARIN LEVEL (UNFRACTIONATED)
HEPARIN UNFRACTIONATED: 0.34 [IU]/mL (ref 0.30–0.70)
HEPARIN UNFRACTIONATED: 0.31 [IU]/mL (ref 0.30–0.70)

## 2017-12-26 LAB — PROTIME-INR
INR: 1.76
Prothrombin Time: 20.3 seconds — ABNORMAL HIGH (ref 11.4–15.2)

## 2017-12-26 NOTE — Progress Notes (Signed)
[facility]   PULMONARY SERVICE  PROGRESS NOTE  Date of Service: December 26, 2017  Rodney Cordova  ZOX:[id number]  DOB: 06/03/1941   DOA: 12/16/2017  Referring Physician: Carron CurieAli Hijazi, MD  HPI: Rodney Cordova is a [age] y.o. male seen for follow up of Acute on Chronic Respiratory Failure.  Patient's been off the ventilator/T collar has been on 40% oxygen.  Patient's been off for more than 24 hours now has been doing well.  Secretions are moderate  Medications: Reviewed on Rounds  Physical Exam:  Vitals: Temperature 98.1 pulse 89 respiratory rate 31 blood pressure 141/84 saturations 98%  Ventilator Settings off the ventilator on T collar  . General: Comfortable at this time . Eyes: Grossly normal lids, irises & conjunctiva . ENT: grossly tongue is normal . Neck: no obvious mass . Cardiovascular: S1-S2 normal no gallop . Respiratory: No rhonchi expansion equal . Abdomen: Soft nontender . Skin: no rash seen on limited exam . Musculoskeletal: not rigid . Psychiatric:unable to assess . Neurologic: no seizure no involuntary movements         Labs on Admission:  Basic Metabolic Panel: Recent Labs  Lab 12/20/17 0130 12/23/17 0523  NA 146* 157*  K 3.5 3.4*  CL 114* 126*  CO2 23 26  GLUCOSE 131* 165*  BUN 98* 91*  CREATININE 3.07* 2.79*  CALCIUM 7.9* 7.8*    Liver Function Tests: No results for input(s): AST, ALT, ALKPHOS, BILITOT, PROT, ALBUMIN in the last 168 hours. No results for input(s): LIPASE, AMYLASE in the last 168 hours. No results for input(s): AMMONIA in the last 168 hours.  CBC: Recent Labs  Lab 12/20/17 0130 12/23/17 0523  WBC 5.9 8.1  HGB 7.8* 7.4*  HCT 26.3* 27.1*  MCV 96.3 103.0*  PLT 210 191    Cardiac Enzymes: No results for input(s): CKTOTAL, CKMB, CKMBINDEX, TROPONINI in the last 168 hours.  BNP (last 3 results) No results for input(s): BNP in the last 8760 hours.  ProBNP (last 3  results) No results for input(s): PROBNP in the last 8760 hours.  Radiological Exams on Admission: No results found.  Assessment/Plan Active Problems:   Pulmonary emboli (HCC)   Acute DVT (deep venous thrombosis) (HCC)   Postoperative intra-abdominal abscess   Acute on chronic respiratory failure with hypoxia (HCC)   Acute cholecystitis   Lobar pneumonia (HCC)   Essential hypertension   1. Acute on chronic respiratory failure with hypoxia patient will continue to wean on protocol.  Wean as on 40% oxygen will drop the oxygen to 35% we will continue for more than 48 hours and then try to downsize the trach and start on capping trials and hopefully 2. Acute pulmonary embolism clinically improved we will continue with supportive care 3. Postoperative intra-abdominal abscess treated with antibiotics we will continue to monitor 4. Lobar pneumonia treated we will continue to follow   I have personally seen and evaluated the patient, evaluated laboratory and imaging results, formulated the assessment and plan and placed orders. The Patient requires high complexity decision making for assessment and support.  Case was discussed on Rounds with the Respiratory Therapy Staff  Yevonne PaxSaadat A Braulio Kiedrowski, MD [facility] Pulmonary Critical Care Medicine Sleep Medicine

## 2017-12-27 ENCOUNTER — Other Ambulatory Visit (HOSPITAL_COMMUNITY): Payer: Medicare Other

## 2017-12-27 DIAGNOSIS — J9621 Acute and chronic respiratory failure with hypoxia: Secondary | ICD-10-CM | POA: Diagnosis not present

## 2017-12-27 DIAGNOSIS — J181 Lobar pneumonia, unspecified organism: Secondary | ICD-10-CM | POA: Diagnosis not present

## 2017-12-27 DIAGNOSIS — I2699 Other pulmonary embolism without acute cor pulmonale: Secondary | ICD-10-CM | POA: Diagnosis not present

## 2017-12-27 DIAGNOSIS — T8149XA Infection following a procedure, other surgical site, initial encounter: Secondary | ICD-10-CM | POA: Diagnosis not present

## 2017-12-27 LAB — BASIC METABOLIC PANEL
Anion gap: 9 (ref 5–15)
BUN: 91 mg/dL — AB (ref 6–20)
CHLORIDE: 130 mmol/L — AB (ref 101–111)
CO2: 25 mmol/L (ref 22–32)
CREATININE: 2.68 mg/dL — AB (ref 0.61–1.24)
Calcium: 7.8 mg/dL — ABNORMAL LOW (ref 8.9–10.3)
GFR calc Af Amer: 25 mL/min — ABNORMAL LOW (ref >60)
GFR calc non Af Amer: 22 mL/min — ABNORMAL LOW (ref >60)
GLUCOSE: 175 mg/dL — AB (ref 65–99)
POTASSIUM: 2.6 mmol/L — AB (ref 3.5–5.1)
SODIUM: 164 mmol/L — AB (ref 135–145)

## 2017-12-27 LAB — HEPARIN LEVEL (UNFRACTIONATED)
HEPARIN UNFRACTIONATED: 0.45 [IU]/mL (ref 0.30–0.70)
Heparin Unfractionated: <0.1 IU/mL — ABNORMAL LOW (ref 0.30–0.70)
Heparin Unfractionated: 0.51 IU/mL (ref 0.30–0.70)

## 2017-12-27 LAB — PROTIME-INR
INR: 1.85
Prothrombin Time: 21.2 seconds — ABNORMAL HIGH (ref 11.4–15.2)

## 2017-12-27 NOTE — Progress Notes (Signed)
Pulmonary Critical Care Medicine [facility] [place]   PULMONARY SERVICE  PROGRESS NOTE  Date of Service:  December 27, 2017  Liev Brockbank  WUJ:[id number]  DOB: [birth year]   DOA: 12/16/2017  Referring Physician: Carron Curie, MD  HPI: Rodney Cordova is a 77 y.o. male seen for follow up of Acute on Chronic Respiratory Failure.  Patient is on the ventilator full support right now has been on assist-control mode requiring 35% oxygen.  Secretions are minimal at this stage.  In addition as been noted to have an altered mental status and CT scan of the head was being considered to further assess  Medications: Reviewed on Rounds  Physical Exam:  Vitals:  Temperature 97.0 degrees pulse 69 respiratory 28 blood pressure 126/78 saturations 95%  Ventilator Settings  Mode of ventilation assist-control FiO2 35% tidal volume 450 peep 5  . General: Comfortable at this time . Eyes: Grossly normal lids, irises & conjunctiva . ENT: grossly tongue is normal . Neck: no obvious mass . Cardiovascular:  S1-S2 normal no gallop . Respiratory:  No rhonchi expansion is equal . Abdomen:  Soft nontender . Skin: no rash seen on limited exam . Musculoskeletal: not rigid . Psychiatric:unable to assess . Neurologic: no seizure no involuntary movements         Labs on Admission:  Basic Metabolic Panel: Recent Labs  Lab 12/23/17 0523 12/27/17 0544  NA 157* 164*  K 3.4* 2.6*  CL 126* 130*  CO2 26 25  GLUCOSE 165* 175*  BUN 91* 91*  CREATININE 2.79* 2.68*  CALCIUM 7.8* 7.8*    Liver Function Tests: No results for input(s): AST, ALT, ALKPHOS, BILITOT, PROT, ALBUMIN in the last 168 hours. No results for input(s): LIPASE, AMYLASE in the last 168 hours. No results for input(s): AMMONIA in the last 168 hours.  CBC: Recent Labs  Lab 12/23/17 0523  WBC 8.1  HGB 7.4*  HCT 27.1*  MCV 103.0*  PLT 191    Cardiac Enzymes: No results for input(s): CKTOTAL, CKMB, CKMBINDEX, TROPONINI in the  last 168 hours.  BNP (last 3 results) No results for input(s): BNP in the last 8760 hours.  ProBNP (last 3 results) No results for input(s): PROBNP in the last 8760 hours.  Radiological Exams on Admission: Ct Head Wo Contrast  Result Date: 12/27/2017 CLINICAL DATA:  Altered mental status EXAM: CT HEAD WITHOUT CONTRAST TECHNIQUE: Contiguous axial images were obtained from the base of the skull through the vertex without intravenous contrast. COMPARISON:  None. 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 Mild atrophic changes are seen. No findings to suggest acute hemorrhage, acute infarction or space-occupying mass lesion are noted. Vascular: No hyperdense vessel or unexpected calcification. Skull: Normal. Negative for fracture or focal lesion. Sinuses/Orbits: Mild mucosal thickening in the left maxillary antrum is noted. 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 No acute abnormality is noted. Electronically Signed   By: Alcide Clever M.D.   On: 12/27/2017 12:23   Dg Chest Port 1 View  Result Date: 12/26/2017 CLINICAL DATA:  Pneumonia.  Respiratory failure. EXAM: PORTABLE CHEST 1 VIEW COMPARISON:  Single-view of the chest 12/16/2017. FINDINGS: The patient's endotracheal tube has been removed with a new tracheostomy tube in place. Tip of the new tube projects at the clavicular heads. NG tube courses into the stomach. Extensive bilateral airspace disease is worse on the left and not notably  change compared to the prior exam. Heart size is normal. No pneumothorax. Pigtail drainage catheter projecting in the right upper quadrant of the abdomen noted. IMPRESSION: New tracheostomy tube projects No acute abnormality. In good position No marked change in left worse than right airspace disease most compatible with pneumonia. Electronically Signed   By: Drusilla Kannerhomas  Dalessio  M.D.   On: 12/26/2017 16:59    Assessment/Plan Active Problems:   Pulmonary emboli (HCC)   Acute DVT (deep venous thrombosis) (HCC)   Postoperative intra-abdominal abscess   Acute on chronic respiratory failure with hypoxia (HCC)   Acute cholecystitis   Lobar pneumonia (HCC)   Essential hypertension   1.  acute on chronic Respiratory failure with hypoxia remains on the ventilator as not been tolerating weaning at this time FiO2 is down to 35% however has had increased work of breathing periodically 2. Pulmonary embolism treated Will continue to monitor 3. Lobar pneumonia follow x-rays as needed last chest x-ray still showing airspace disease present consider a CT scan follow-up 4. Postoperative intra-abdominal abscess treated with antibiotics 5. 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 The Patient requires high complexity decision making for assessment and support.  Case was discussed on Rounds with the Respiratory Therapy Staff  Yevonne PaxSaadat A Arthea Nobel, MD [facility] Pulmonary Critical Care Medicine Sleep Medicine

## 2017-12-28 DIAGNOSIS — J9621 Acute and chronic respiratory failure with hypoxia: Secondary | ICD-10-CM | POA: Diagnosis not present

## 2017-12-28 DIAGNOSIS — J181 Lobar pneumonia, unspecified organism: Secondary | ICD-10-CM | POA: Diagnosis not present

## 2017-12-28 DIAGNOSIS — T8149XA Infection following a procedure, other surgical site, initial encounter: Secondary | ICD-10-CM | POA: Diagnosis not present

## 2017-12-28 DIAGNOSIS — I2699 Other pulmonary embolism without acute cor pulmonale: Secondary | ICD-10-CM | POA: Diagnosis not present

## 2017-12-28 LAB — PROTIME-INR
INR: 2.1
PROTHROMBIN TIME: 23.4 s — AB (ref 11.4–15.2)

## 2017-12-28 LAB — BASIC METABOLIC PANEL
ANION GAP: 9 (ref 5–15)
BUN: 92 mg/dL — ABNORMAL HIGH (ref 6–20)
CALCIUM: 7.2 mg/dL — AB (ref 8.9–10.3)
CO2: 22 mmol/L (ref 22–32)
Chloride: 122 mmol/L — ABNORMAL HIGH (ref 101–111)
Creatinine, Ser: 2.68 mg/dL — ABNORMAL HIGH (ref 0.61–1.24)
GFR, EST AFRICAN AMERICAN: 25 mL/min — AB (ref >60)
GFR, EST NON AFRICAN AMERICAN: 22 mL/min — AB (ref >60)
Glucose, Bld: 185 mg/dL — ABNORMAL HIGH (ref 65–99)
Potassium: 3.2 mmol/L — ABNORMAL LOW (ref 3.5–5.1)
SODIUM: 153 mmol/L — AB (ref 135–145)

## 2017-12-28 LAB — HEPARIN LEVEL (UNFRACTIONATED): Heparin Unfractionated: 0.16 IU/mL — ABNORMAL LOW (ref 0.30–0.70)

## 2017-12-28 NOTE — Progress Notes (Signed)
Pulmonary Critical Care Medicine [facility] [place]   PULMONARY SERVICE  PROGRESS NOTE  Date of Service: December 28, 2017  Rodney Cordova  ZOX:[id number]  DOB: [birth year]   DOA: 12/16/2017  Referring Physician: Carron Curie, MD  HPI: Rodney Cordova is a 77 y.o. male seen for follow up of Acute on Chronic Respiratory Failure.  Patient continues on the ventilator has not been able to tolerate weaning attempts.  Had a chest x-ray done which showed persistence of the pneumonia left greater than right.  Remains right now on full support assist control mode in addition has had worsening of his sodium and this is being addressed by the primary care team.  This may explain his mental status changes.  CT of the head was unremarkable  Medications: Reviewed on Rounds  Physical Exam:  Vitals: Temperature 98.4 pulse 89 respiratory rate 29 blood pressure 114/84 saturations 94%  Ventilator Settings mode of ventilation assist control FiO2 20% 491 PEEP 5  . General: Comfortable at this time . Eyes: Grossly normal lids, irises & conjunctiva . ENT: grossly tongue is normal . Neck: no obvious mass . Cardiovascular: S1-S2 normal no gallop . Respiratory: No rhonchi expansion equal . Abdomen: Soft and nontender . Skin: no rash seen on limited exam . Musculoskeletal: not rigid . Psychiatric:unable to assess . Neurologic: no seizure no involuntary movements         Labs on Admission:  Basic Metabolic Panel: Recent Labs  Lab 12/23/17 0523 12/27/17 0544  NA 157* 164*  K 3.4* 2.6*  CL 126* 130*  CO2 26 25  GLUCOSE 165* 175*  BUN 91* 91*  CREATININE 2.79* 2.68*  CALCIUM 7.8* 7.8*    Liver Function Tests: No results for input(s): AST, ALT, ALKPHOS, BILITOT, PROT, ALBUMIN in the last 168 hours. No results for input(s): LIPASE, AMYLASE in the last 168 hours. No results for input(s): AMMONIA in the last 168 hours.  CBC: Recent Labs  Lab 12/23/17 0523  WBC 8.1  HGB 7.4*  HCT  27.1*  MCV 103.0*  PLT 191    Cardiac Enzymes: No results for input(s): CKTOTAL, CKMB, CKMBINDEX, TROPONINI in the last 168 hours.  BNP (last 3 results) No results for input(s): BNP in the last 8760 hours.  ProBNP (last 3 results) No results for input(s): PROBNP in the last 8760 hours.  Radiological Exams on Admission: Ct Head Wo Contrast  Result Date: 12/27/2017 CLINICAL DATA:  Altered mental status EXAM: CT HEAD WITHOUT CONTRAST TECHNIQUE: Contiguous axial images were obtained from the base of the skull through the vertex without intravenous contrast. COMPARISON:  None. 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 Mild atrophic changes are seen. No findings to suggest acute hemorrhage, acute infarction or space-occupying mass lesion are noted. Vascular: No hyperdense vessel or unexpected calcification. Skull: Normal. Negative for fracture or focal lesion. Sinuses/Orbits: Mild mucosal thickening in the left maxillary antrum is noted. 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 No acute abnormality is noted. Electronically Signed   By: Alcide Clever M.D.   On: 12/27/2017 12:23   Dg Chest Port 1 View  Result Date: 12/26/2017 CLINICAL DATA:  Pneumonia.  Respiratory failure. EXAM: PORTABLE CHEST 1 VIEW COMPARISON:  Single-view of the chest 12/16/2017. FINDINGS: The patient's endotracheal tube has been removed with a new tracheostomy tube in place. Tip of the new tube projects at the clavicular heads. NG  tube courses into the stomach. Extensive bilateral airspace disease is worse on the left and not notably change compared to the prior exam. Heart size is normal. No pneumothorax. Pigtail drainage catheter projecting in the right upper quadrant of the abdomen noted. IMPRESSION: New tracheostomy tube projects No acute abnormality. In good position No marked  change in left worse than right airspace disease most compatible with pneumonia. Electronically Signed   By: Drusilla Kannerhomas  Dalessio M.D.   On: 12/26/2017 16:59    Assessment/Plan Active Problems:   Pulmonary emboli (HCC)   Acute DVT (deep venous thrombosis) (HCC)   Postoperative intra-abdominal abscess   Acute on chronic respiratory failure with hypoxia (HCC)   Acute cholecystitis   Lobar pneumonia (HCC)   Essential hypertension   1. Acute on chronic respiratory failure with hypoxia patient is not able to tolerate weaning had to be placed back on the ventilator as already noted.  Will titrate oxygen as tolerated continue pulmonary toilet supportive care 2. Lobar pneumonia seems to be persistent discussed on rounds possibility of doing a tracheostomy if there is no improvement noted on follow-up films.  Would also consider doing a CT of the chest when the sodium issue is improved 3. Postoperative intra-abdominal abscess treated with antibiotics will continue 4. Pulmonary embolism has been treated   I have personally seen and evaluated the patient, evaluated laboratory and imaging results, formulated the assessment and plan and placed orders. The Patient requires high complexity decision making for assessment and support.  Case was discussed on Rounds with the Respiratory Therapy Staff  Yevonne PaxSaadat A Linnae Rasool, MD [facility] Pulmonary Critical Care Medicine Sleep Medicine

## 2017-12-29 DIAGNOSIS — T8149XA Infection following a procedure, other surgical site, initial encounter: Secondary | ICD-10-CM | POA: Diagnosis not present

## 2017-12-29 DIAGNOSIS — I2699 Other pulmonary embolism without acute cor pulmonale: Secondary | ICD-10-CM | POA: Diagnosis not present

## 2017-12-29 DIAGNOSIS — J9621 Acute and chronic respiratory failure with hypoxia: Secondary | ICD-10-CM | POA: Diagnosis not present

## 2017-12-29 DIAGNOSIS — J181 Lobar pneumonia, unspecified organism: Secondary | ICD-10-CM | POA: Diagnosis not present

## 2017-12-29 LAB — BASIC METABOLIC PANEL
Anion gap: 3 — ABNORMAL LOW (ref 5–15)
BUN: 82 mg/dL — AB (ref 6–20)
CALCIUM: 7.3 mg/dL — AB (ref 8.9–10.3)
CHLORIDE: 122 mmol/L — AB (ref 101–111)
CO2: 23 mmol/L (ref 22–32)
CREATININE: 2.55 mg/dL — AB (ref 0.61–1.24)
GFR, EST AFRICAN AMERICAN: 27 mL/min — AB (ref >60)
GFR, EST NON AFRICAN AMERICAN: 23 mL/min — AB (ref >60)
Glucose, Bld: 163 mg/dL — ABNORMAL HIGH (ref 65–99)
Potassium: 2.8 mmol/L — ABNORMAL LOW (ref 3.5–5.1)
SODIUM: 148 mmol/L — AB (ref 135–145)

## 2017-12-29 LAB — PROTIME-INR
INR: 2.39
Prothrombin Time: 25.8 seconds — ABNORMAL HIGH (ref 11.4–15.2)

## 2017-12-29 NOTE — Progress Notes (Signed)
Pulmonary Critical Care Medicine [facility] [place]   PULMONARY SERVICE  PROGRESS NOTE  Date of Service: December 29, 2017  Rodney Cordova  UJW:[id number]  DOB: 17-Mar-1941   DOA: 12/16/2017  Referring Physician: Carron Curie, MD  HPI: Rodney Cordova is a 77 y.o. male seen for follow up of Acute on Chronic Respiratory Failure.  Patient remains on pressure support should be able to advance to wean to T collar soon.  Right now is on 35% FiO2 with good saturations.  Good volumes are noted currently is on a pressure support of 12/5  Medications: Reviewed on Rounds  Physical Exam:  Vitals: Temperature 98.3 pulse 92 respiratory rate 27 blood pressure 100/56 saturations 98%  Ventilator Settings mode of ventilation pressure support FiO2 35% tidal volume 469 pressure support 12/5  . General: Comfortable at this time . Eyes: Grossly normal lids, irises & conjunctiva . ENT: grossly tongue is normal . Neck: no obvious mass . Cardiovascular: S1-S2 normal no gallop or rub . Respiratory: No rhonchi expansion is equal . Abdomen: Soft and nontender . Skin: no rash seen on limited exam . Musculoskeletal: not rigid . Psychiatric:unable to assess . Neurologic: no seizure no involuntary movements         Labs on Admission:  Basic Metabolic Panel: Recent Labs  Lab 12/23/17 0523 12/27/17 0544 12/28/17 0838 12/29/17 0819  NA 157* 164* 153* 148*  K 3.4* 2.6* 3.2* 2.8*  CL 126* 130* 122* 122*  CO2 26 25 22 23   GLUCOSE 165* 175* 185* 163*  BUN 91* 91* 92* 82*  CREATININE 2.79* 2.68* 2.68* 2.55*  CALCIUM 7.8* 7.8* 7.2* 7.3*    Liver Function Tests: No results for input(s): AST, ALT, ALKPHOS, BILITOT, PROT, ALBUMIN in the last 168 hours. No results for input(s): LIPASE, AMYLASE in the last 168 hours. No results for input(s): AMMONIA in the last 168 hours.  CBC: Recent Labs  Lab 12/23/17 0523  WBC 8.1  HGB 7.4*  HCT 27.1*  MCV 103.0*  PLT 191    Cardiac Enzymes: No  results for input(s): CKTOTAL, CKMB, CKMBINDEX, TROPONINI in the last 168 hours.  BNP (last 3 results) No results for input(s): BNP in the last 8760 hours.  ProBNP (last 3 results) No results for input(s): PROBNP in the last 8760 hours.  Radiological Exams on Admission: Ct Head Wo Contrast  Result Date: 12/27/2017 CLINICAL DATA:  Altered mental status EXAM: CT HEAD WITHOUT CONTRAST TECHNIQUE: Contiguous axial images were obtained from the base of the skull through the vertex without intravenous contrast. COMPARISON:  None. 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 Mild atrophic changes are seen. No findings to suggest acute hemorrhage, acute infarction or space-occupying mass lesion are noted. Vascular: No hyperdense vessel or unexpected calcification. Skull: Normal. Negative for fracture or focal lesion. Sinuses/Orbits: Mild mucosal thickening in the left maxillary antrum is noted. 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 No acute abnormality is noted. Electronically Signed   By: Alcide Clever M.D.   On: 12/27/2017 12:23   Dg Chest Port 1 View  Result Date: 12/26/2017 CLINICAL DATA:  Pneumonia.  Respiratory failure. EXAM: PORTABLE CHEST 1 VIEW COMPARISON:  Single-view of the chest 12/16/2017. FINDINGS: The patient's endotracheal tube has been removed with a new tracheostomy tube in place. Tip of the new tube projects at the clavicular heads. NG tube courses into the stomach. Extensive bilateral  airspace disease is worse on the left and not notably change compared to the prior exam. Heart size is normal. No pneumothorax. Pigtail drainage catheter projecting in the right upper quadrant of the abdomen noted. IMPRESSION: New tracheostomy tube projects No acute abnormality. In good position No marked change in left worse than right airspace disease most  compatible with pneumonia. Electronically Signed   By: Drusilla Kannerhomas  Dalessio M.D.   On: 12/26/2017 16:59    Assessment/Plan Active Problems:   Pulmonary emboli (HCC)   Acute DVT (deep venous thrombosis) (HCC)   Postoperative intra-abdominal abscess   Acute on chronic respiratory failure with hypoxia (HCC)   Acute cholecystitis   Lobar pneumonia (HCC)   Essential hypertension   1. Acute on chronic respiratory failure with hypoxia patient right pressure support as noted above will advance to weaning to T collar tomorrow 2. Pulmonary emboli treated we will continue to monitor 3. Postoperative intra-abdominal abscess treated with antibiotics we will continue supportive care 4. Lobar pneumonia treated we will continue to monitor   I have personally seen and evaluated the patient, evaluated laboratory and imaging results, formulated the assessment and plan and placed orders. The Patient requires high complexity decision making for assessment and support.  Case was discussed on Rounds with the Respiratory Therapy Staff  Yevonne PaxSaadat A Khan, MD [facility] Pulmonary Critical Care Medicine Sleep Medicine

## 2017-12-30 LAB — PROTIME-INR
INR: 2.34
PROTHROMBIN TIME: 25.5 s — AB (ref 11.4–15.2)

## 2017-12-30 LAB — BASIC METABOLIC PANEL
ANION GAP: 6 (ref 5–15)
BUN: 71 mg/dL — ABNORMAL HIGH (ref 6–20)
CALCIUM: 7.4 mg/dL — AB (ref 8.9–10.3)
CO2: 23 mmol/L (ref 22–32)
Chloride: 119 mmol/L — ABNORMAL HIGH (ref 101–111)
Creatinine, Ser: 2.26 mg/dL — ABNORMAL HIGH (ref 0.61–1.24)
GFR, EST AFRICAN AMERICAN: 31 mL/min — AB (ref >60)
GFR, EST NON AFRICAN AMERICAN: 26 mL/min — AB (ref >60)
Glucose, Bld: 170 mg/dL — ABNORMAL HIGH (ref 65–99)
Potassium: 3.3 mmol/L — ABNORMAL LOW (ref 3.5–5.1)
SODIUM: 148 mmol/L — AB (ref 135–145)

## 2017-12-31 LAB — PROTIME-INR
INR: 2.56
Prothrombin Time: 27.3 seconds — ABNORMAL HIGH (ref 11.4–15.2)

## 2018-01-01 ENCOUNTER — Other Ambulatory Visit (HOSPITAL_COMMUNITY): Payer: Medicare Other

## 2018-01-01 DIAGNOSIS — J15212 Pneumonia due to Methicillin resistant Staphylococcus aureus: Secondary | ICD-10-CM

## 2018-01-01 DIAGNOSIS — I2699 Other pulmonary embolism without acute cor pulmonale: Secondary | ICD-10-CM | POA: Diagnosis not present

## 2018-01-01 DIAGNOSIS — J9621 Acute and chronic respiratory failure with hypoxia: Secondary | ICD-10-CM | POA: Diagnosis not present

## 2018-01-01 DIAGNOSIS — J181 Lobar pneumonia, unspecified organism: Secondary | ICD-10-CM | POA: Diagnosis not present

## 2018-01-01 LAB — CBC
HCT: 23.5 % — ABNORMAL LOW (ref 39.0–52.0)
HCT: 28.3 % — ABNORMAL LOW (ref 39.0–52.0)
Hemoglobin: 6.8 g/dL — CL (ref 13.0–17.0)
Hemoglobin: 8.4 g/dL — ABNORMAL LOW (ref 13.0–17.0)
MCH: 28.8 pg (ref 26.0–34.0)
MCH: 28.6 pg (ref 26.0–34.0)
MCHC: 28.9 g/dL — ABNORMAL LOW (ref 30.0–36.0)
MCHC: 29.7 g/dL — ABNORMAL LOW (ref 30.0–36.0)
MCV: 99.6 fL (ref 78.0–100.0)
MCV: 96.3 fL (ref 78.0–100.0)
PLATELETS: 203 10*3/uL (ref 150–400)
Platelets: 215 K/uL (ref 150–400)
RBC: 2.36 MIL/uL — ABNORMAL LOW (ref 4.22–5.81)
RBC: 2.94 MIL/uL — ABNORMAL LOW (ref 4.22–5.81)
RDW: 18.7 % — ABNORMAL HIGH (ref 11.5–15.5)
RDW: 19.5 % — AB (ref 11.5–15.5)
WBC: 7.9 K/uL (ref 4.0–10.5)
WBC: 9 10*3/uL (ref 4.0–10.5)

## 2018-01-01 LAB — BASIC METABOLIC PANEL WITH GFR
Anion gap: 10 (ref 5–15)
BUN: 42 mg/dL — ABNORMAL HIGH (ref 6–20)
CO2: 23 mmol/L (ref 22–32)
Calcium: 7.4 mg/dL — ABNORMAL LOW (ref 8.9–10.3)
Chloride: 109 mmol/L (ref 101–111)
Creatinine, Ser: 1.64 mg/dL — ABNORMAL HIGH (ref 0.61–1.24)
GFR calc Af Amer: 45 mL/min — ABNORMAL LOW
GFR calc non Af Amer: 39 mL/min — ABNORMAL LOW
Glucose, Bld: 187 mg/dL — ABNORMAL HIGH (ref 65–99)
Potassium: 2.9 mmol/L — ABNORMAL LOW (ref 3.5–5.1)
Sodium: 142 mmol/L (ref 135–145)

## 2018-01-01 LAB — C DIFFICILE QUICK SCREEN W PCR REFLEX
C Diff antigen: NEGATIVE
C Diff interpretation: NOT DETECTED
C Diff toxin: NEGATIVE

## 2018-01-01 LAB — CULTURE, RESPIRATORY W GRAM STAIN

## 2018-01-01 LAB — PROTIME-INR
INR: 2.26
Prothrombin Time: 24.8 s — ABNORMAL HIGH (ref 11.4–15.2)

## 2018-01-01 LAB — MAGNESIUM: Magnesium: 1.3 mg/dL — ABNORMAL LOW (ref 1.7–2.4)

## 2018-01-01 LAB — PREPARE RBC (CROSSMATCH)

## 2018-01-01 NOTE — Progress Notes (Signed)
Pulmonary Critical Care Medicine [facility] [place]   PULMONARY SERVICE  PROGRESS NOTE  Date of Service: 01/01/2018  Rodney Cordova  ZOX:[id number]  DOB: July 05, 1941   DOA: 12/16/2017  Referring Physician: Carron Curie, MD  HPI: Rodney Cordova is a 77 y.o. male seen for follow up of Acute on Chronic Respiratory Failure.  Patient is on full vent support with worsening of the chest x-ray.  The patient did not tolerate any weaning attempts and did not tolerate being off of the ventilator even for short time.  Patient's family was present at the bedside and his wife was updated.  Sputum is growing MRSA and will discuss further with primary care regarding the management  Medications: Reviewed on Rounds  Physical Exam:  Vitals: Temperature 97.6 pulse 105 respiratory rate 36 blood pressure 152/75 saturations 98%  Ventilator Settings assist control FiO2 35% tidal volume 450 PEEP 5  . General: Comfortable at this time . Eyes: Grossly normal lids, irises & conjunctiva . ENT: grossly tongue is normal . Neck: no obvious mass . Cardiovascular: S1-S2 normal no gallop or rub . Respiratory: No rhonchi expansion is equal . Abdomen: Soft nontender . Skin: no rash seen on limited exam . Musculoskeletal: not rigid . Psychiatric:unable to assess . Neurologic: no seizure no involuntary movements         Labs on Admission:  Basic Metabolic Panel: Recent Labs  Lab 12/27/17 0544 12/28/17 0838 12/29/17 0819 12/30/17 0339 01/01/18 0939  NA 164* 153* 148* 148* 142  K 2.6* 3.2* 2.8* 3.3* 2.9*  CL 130* 122* 122* 119* 109  CO2 25 22 23 23 23   GLUCOSE 175* 185* 163* 170* 187*  BUN 91* 92* 82* 71* 42*  CREATININE 2.68* 2.68* 2.55* 2.26* 1.64*  CALCIUM 7.8* 7.2* 7.3* 7.4* 7.4*  MG  --   --   --   --  1.3*    Liver Function Tests: No results for input(s): AST, ALT, ALKPHOS, BILITOT, PROT, ALBUMIN in the last 168 hours. No results for input(s): LIPASE, AMYLASE in the last 168 hours. No  results for input(s): AMMONIA in the last 168 hours.  CBC: Recent Labs  Lab 01/01/18 0939  WBC 7.9  HGB 6.8*  HCT 23.5*  MCV 99.6  PLT 215    Cardiac Enzymes: No results for input(s): CKTOTAL, CKMB, CKMBINDEX, TROPONINI in the last 168 hours.  BNP (last 3 results) No results for input(s): BNP in the last 8760 hours.  ProBNP (last 3 results) No results for input(s): PROBNP in the last 8760 hours.  Radiological Exams on Admission: Dg Chest Port 1 View  Result Date: 01/01/2018 CLINICAL DATA:  Fever and increased shortness of breath today EXAM: PORTABLE CHEST 1 VIEW COMPARISON:  Portable exam 1004 hours compared to 12/26/2017 FINDINGS: Tip of tracheostomy tube projects 5.5 cm above carina. Nasogastric tube extends into stomach. Normal heart size, mediastinal contours, and pulmonary vascularity. BILATERAL pulmonary infiltrates, least severe in lower RIGHT lung, most likely representing multifocal pneumonia. No gross pleural effusion or pneumothorax. IMPRESSION: Persistent BILATERAL asymmetric pulmonary infiltrates favoring multifocal pneumonia, little changed. Electronically Signed   By: Ulyses Southward M.D.   On: 01/01/2018 10:20   Dg Abd Portable 1v  Result Date: 01/01/2018 CLINICAL DATA:  Fever and increased shortness of breath today. Increased abdominal distension for the past 3 weeks. Assess NG tube position. EXAM: PORTABLE ABDOMEN - 1 VIEW COMPARISON:  Abdominal radiograph of December 21, 2017 FINDINGS: There is a relative paucity of bowel gas. A small amount  of gas is noted in the ascending colon. The NG tube tip projects in the region of the pylorus or proximal duodenum. There is a pigtail drainage catheter in the right upper quadrant which is partially visualized today. There is an inferior vena caval filter present. There are surgical clips in the gallbladder fossa with additional surgical clips overlying the right aspect of the sacrum. IMPRESSION: Relative paucity of bowel gas. No evidence  of obstruction or ileus. The NG tube is in reasonable position with the tip projecting in the distal pylorus or proximal duodenum. There is a drainage catheter in the right upper quadrant, presumably a cholecystostomy tube, which is partially included in the field of view. Electronically Signed   By: David  SwazilandJordan M.D.   On: 01/01/2018 10:20    Assessment/Plan Active Problems:   Pulmonary emboli (HCC)   Acute DVT (deep venous thrombosis) (HCC)   Postoperative intra-abdominal abscess   Acute on chronic respiratory failure with hypoxia (HCC)   Acute cholecystitis   Lobar pneumonia (HCC)   Essential hypertension   1. Acute on chronic respiratory failure with hypoxia patient is doing about the same right now not able to do any weaning.  We will continue with full vent support we will continue to check the RSV periodically. 2. Lobar pneumonia and sputum is still growing MRSA chest antibiotics as needed continue with supportive care follow-up x-rays as needed CT scan of the chest was ordered 3. Postoperative intra-abdominal abscess prognosis guarded 4. Pulmonary emboli continue with present management   I have personally seen and evaluated the patient, evaluated laboratory and imaging results, formulated the assessment and plan and placed orders. The Patient requires high complexity decision making for assessment and support.  Case was discussed on Rounds with the Respiratory Therapy Staff  Yevonne PaxSaadat A Clint Strupp, MD [facility] Pulmonary Critical Care Medicine Sleep Medicine

## 2018-01-02 ENCOUNTER — Other Ambulatory Visit (HOSPITAL_COMMUNITY): Payer: Medicare Other

## 2018-01-02 DIAGNOSIS — I2699 Other pulmonary embolism without acute cor pulmonale: Secondary | ICD-10-CM | POA: Diagnosis not present

## 2018-01-02 DIAGNOSIS — T8149XA Infection following a procedure, other surgical site, initial encounter: Secondary | ICD-10-CM | POA: Diagnosis not present

## 2018-01-02 DIAGNOSIS — J9621 Acute and chronic respiratory failure with hypoxia: Secondary | ICD-10-CM | POA: Diagnosis not present

## 2018-01-02 DIAGNOSIS — J181 Lobar pneumonia, unspecified organism: Secondary | ICD-10-CM | POA: Diagnosis not present

## 2018-01-02 LAB — BASIC METABOLIC PANEL
ANION GAP: 6 (ref 5–15)
BUN: 34 mg/dL — ABNORMAL HIGH (ref 6–20)
CALCIUM: 7.3 mg/dL — AB (ref 8.9–10.3)
CO2: 25 mmol/L (ref 22–32)
CREATININE: 1.49 mg/dL — AB (ref 0.61–1.24)
Chloride: 109 mmol/L (ref 101–111)
GFR, EST AFRICAN AMERICAN: 51 mL/min — AB (ref >60)
GFR, EST NON AFRICAN AMERICAN: 44 mL/min — AB (ref >60)
Glucose, Bld: 172 mg/dL — ABNORMAL HIGH (ref 65–99)
Potassium: 3.6 mmol/L (ref 3.5–5.1)
Sodium: 140 mmol/L (ref 135–145)

## 2018-01-02 LAB — TYPE AND SCREEN
ABO/RH(D): O POS
Antibody Screen: NEGATIVE
UNIT DIVISION: 0

## 2018-01-02 LAB — BPAM RBC
Blood Product Expiration Date: 201905012359
ISSUE DATE / TIME: 201904081330
UNIT TYPE AND RH: 5100

## 2018-01-02 LAB — PROTIME-INR
INR: 2.5
PROTHROMBIN TIME: 26.8 s — AB (ref 11.4–15.2)

## 2018-01-02 LAB — MAGNESIUM: MAGNESIUM: 1.3 mg/dL — AB (ref 1.7–2.4)

## 2018-01-02 NOTE — Progress Notes (Signed)
Pulmonary Critical Care Medicine [facility] [place]   PULMONARY SERVICE  PROGRESS NOTE  Date of Service: 01/02/2018  Rodney Cordova  WUJ:[id number]  DOB: 11-26-1940   DOA: 12/16/2017  Referring Physician: Carron Curie, MD  HPI: Rodney Cordova is a 77 y.o. male seen for follow up of Acute on Chronic Respiratory Failure.  Remains on the ventilator right now is in full support in assist-control mode.  CT scan of the chest was done which shows diffuse interstitial pneumonitis.  I suspect that this is not a new finding and he likely had interstitial pneumonitis prior to getting ill.  Medications: Reviewed on Rounds  Physical Exam:  Vitals:   Temperature 98.5 degrees pulse 95 respiratory 28 blood pressure 135/78 saturations 98%  Ventilator Settings  Mode of ventilation assist-control FiO2 35% tidal volume 467 peep 5  . General: Comfortable at this time . Eyes: Grossly normal lids, irises & conjunctiva . ENT: grossly tongue is normal . Neck: no obvious mass . Cardiovascular:  S1-S2 normal no gallop or rub . Respiratory:  No rhonchi expansion is equal . Abdomen:  Soft nontender . Skin: no rash seen on limited exam . Musculoskeletal: not rigid . Psychiatric:unable to assess . Neurologic: no seizure no involuntary movements         Labs on Admission:  Basic Metabolic Panel: Recent Labs  Lab 12/28/17 0838 12/29/17 0819 12/30/17 0339 01/01/18 0939 01/02/18 0719  NA 153* 148* 148* 142 140  K 3.2* 2.8* 3.3* 2.9* 3.6  CL 122* 122* 119* 109 109  CO2 22 23 23 23 25   GLUCOSE 185* 163* 170* 187* 172*  BUN 92* 82* 71* 42* 34*  CREATININE 2.68* 2.55* 2.26* 1.64* 1.49*  CALCIUM 7.2* 7.3* 7.4* 7.4* 7.3*  MG  --   --   --  1.3* 1.3*    Liver Function Tests: No results for input(s): AST, ALT, ALKPHOS, BILITOT, PROT, ALBUMIN in the last 168 hours. No results for input(s): LIPASE, AMYLASE in the last 168 hours. No results for input(s): AMMONIA in the last 168  hours.  CBC: Recent Labs  Lab 01/01/18 0939 01/01/18 1850  WBC 7.9 9.0  HGB 6.8* 8.4*  HCT 23.5* 28.3*  MCV 99.6 96.3  PLT 215 203    Cardiac Enzymes: No results for input(s): CKTOTAL, CKMB, CKMBINDEX, TROPONINI in the last 168 hours.  BNP (last 3 results) No results for input(s): BNP in the last 8760 hours.  ProBNP (last 3 results) No results for input(s): PROBNP in the last 8760 hours.  Radiological Exams on Admission: Ct Chest Wo Contrast  Addendum Date: 01/02/2018   ADDENDUM REPORT: 01/02/2018 09:03 ADDENDUM: As mentioned in the body of the report, but not included in the original impressions section, there is a displaced fracture of the mid sternum. This may simply represent a healing fracture, however, the appearance of the fracture raises concern for potential underlying osseous lesion (i.e., pathologic fracture). Clinical correlation for history of recent trauma the could explain this finding is recommended. In the absence of such history, further evaluation to exclude occult malignancy should be considered. Electronically Signed   By: Trudie Reed M.D.   On: 01/02/2018 09:03   Result Date: 01/02/2018 CLINICAL DATA:  77 year old male with history of multifocal pneumonia. History of pulmonary emboli. Acute respiratory failure with hypoxia. Deep venous thrombosis. EXAM: CT CHEST WITHOUT CONTRAST TECHNIQUE: Multidetector CT imaging of the chest was performed following the standard protocol without IV contrast. COMPARISON:  No priors. FINDINGS: Cardiovascular: Heart size  is normal. There is no significant pericardial fluid, thickening or pericardial calcification. Atherosclerosis of the thoracic aorta with ectasia of ascending thoracic aorta which measures up to 4.2 cm in diameter. No definite coronary artery calcifications. Mediastinum/Nodes: No pathologically enlarged mediastinal or hilar lymph nodes. Please note that accurate exclusion of hilar adenopathy is limited on  noncontrast CT scans. Nasogastric tube extending into the stomach. Esophagus is otherwise unremarkable in appearance. No axillary lymphadenopathy. Tracheostomy tube in position with tip in the upper trachea. Lungs/Pleura: Extensive respiratory motion limits assessment for smaller pulmonary nodules. No large pulmonary nodule or mass is confidently identified. A few scattered calcified pulmonary nodules are noted in the right lung, compatible with calcified granulomas. No acute consolidative airspace disease is noted. There are widespread areas of septal thickening, mild cylindrical bronchiectasis, peripheral bronchiolectasis and what appears to be honeycombing scattered throughout the lungs bilaterally, with a mild craniocaudal gradient, suspicious for interstitial lung disease such as usual interstitial pneumonia. Small bilateral pleural effusions. Upper Abdomen: Pigtail drainage catheter in the right upper quadrant in a subhepatic location, similar to the prior examination. Status post cholecystectomy. Tip of an IVC filter is noted, but incompletely imaged. Musculoskeletal: There is a displaced fracture of the mid sternum (1 shaft width of anterior displacement of the distal fracture fragment) with destructive bony changes adjacent to the fracture, which could simply be reflective of normal osseous healing, however, the possibility of an underlying lesion (i.e., a pathologic fracture) is not excluded. IMPRESSION: 1. Small bilateral pleural effusions. 2. No evidence to suggest multilobar pneumonia. 3. There is a spectrum of findings in the lungs suggestive of interstitial lung disease, potentially usual interstitial pneumonia, as discussed above. Further evaluation with nonemergent high-resolution chest CT is recommended after resolution of the patient's acute illness to better evaluate these findings. 4. 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 5. Additional  incidental findings, as above. Aortic Atherosclerosis (ICD10-I70.0). Electronically Signed: By: Daniel  EntrikinTrudie Reed M.D. On: 01/02/2018 08:49   Dg Chest Port 1 View  Result Date: 01/01/2018 CLINICAL DATA:  Fever and increased shortness of breath today EXAM: PORTABLE CHEST 1 VIEW COMPARISON:  Portable exam 1004 hours compared to 12/26/2017 FINDINGS: Tip of tracheostomy tube projects 5.5 cm above carina. Nasogastric tube extends into stomach. Normal heart size, mediastinal contours, and pulmonary vascularity. BILATERAL pulmonary infiltrates, least severe in lower RIGHT lung, most likely representing multifocal pneumonia. No gross pleural effusion or pneumothorax. IMPRESSION: Persistent BILATERAL asymmetric pulmonary infiltrates favoring multifocal pneumonia, little changed. Electronically Signed   By: Ulyses SouthwardMark  Boles M.D.   On: 01/01/2018 10:20   Dg Abd Portable 1v  Result Date: 01/01/2018 CLINICAL DATA:  Fever and increased shortness of breath today. Increased abdominal distension for the past 3 weeks. Assess NG tube position. EXAM: PORTABLE ABDOMEN - 1 VIEW COMPARISON:  Abdominal radiograph of December 21, 2017 FINDINGS: There is a relative paucity of bowel gas. A small amount of gas is noted in the ascending colon. The NG tube tip projects in the region of the pylorus or proximal duodenum. There is a pigtail drainage catheter in the right upper quadrant which is partially visualized today. There is an inferior vena caval filter present. There are surgical clips in the gallbladder fossa with additional surgical clips overlying the right aspect of the sacrum. IMPRESSION: Relative paucity of bowel gas. No evidence of obstruction or ileus. The NG tube is in reasonable position with the tip projecting in the distal pylorus or proximal duodenum. There  is a drainage catheter in the right upper quadrant, presumably a cholecystostomy tube, which is partially included in the field of view. Electronically Signed   By: David   [place] M.D.   On: 01/01/2018 10:20    Assessment/Plan Active Problems:   Pulmonary emboli (HCC)   Acute DVT (deep venous thrombosis) (HCC)   Postoperative intra-abdominal abscess   Acute on chronic respiratory failure with hypoxia (HCC)   Acute cholecystitis   Lobar pneumonia (HCC)   Essential hypertension   1.  acute on chronic Respiratory failure with hypoxia patient right now is not tolerating weaning remains on full support in assist-control.  Will continue to assess the RSBI and mechanics and try on pressure support again 2. Pulmonary emboli treated will continue to monitor 3. 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 Lobar pneumonia treated with antibiotics we will consider doing a bronchoscopy for better cultures in case there is an underlying atypical infectious process going on 5. Postoperative intra-abdominal abscess right now is stable Will continue to monitor   I have personally seen and evaluated the patient, evaluated laboratory and imaging results, formulated the assessment and plan and placed orders. The Patient requires high complexity decision making for assessment and support.  Case was discussed on Rounds with the Respiratory Therapy Staff  Yevonne Pax, MD [facility] Pulmonary Critical Care Medicine Sleep Medicine

## 2018-01-03 DIAGNOSIS — J8489 Other specified interstitial pulmonary diseases: Secondary | ICD-10-CM

## 2018-01-03 DIAGNOSIS — J181 Lobar pneumonia, unspecified organism: Secondary | ICD-10-CM | POA: Diagnosis not present

## 2018-01-03 DIAGNOSIS — I2699 Other pulmonary embolism without acute cor pulmonale: Secondary | ICD-10-CM | POA: Diagnosis not present

## 2018-01-03 DIAGNOSIS — T8149XA Infection following a procedure, other surgical site, initial encounter: Secondary | ICD-10-CM | POA: Diagnosis not present

## 2018-01-03 DIAGNOSIS — J9621 Acute and chronic respiratory failure with hypoxia: Secondary | ICD-10-CM | POA: Diagnosis not present

## 2018-01-03 LAB — PROTIME-INR
INR: 1.96
PROTHROMBIN TIME: 22.2 s — AB (ref 11.4–15.2)

## 2018-01-03 LAB — CBC
HEMATOCRIT: 25.2 % — AB (ref 39.0–52.0)
HEMOGLOBIN: 7.8 g/dL — AB (ref 13.0–17.0)
MCH: 30 pg (ref 26.0–34.0)
MCHC: 31 g/dL (ref 30.0–36.0)
MCV: 96.9 fL (ref 78.0–100.0)
PLATELETS: 240 10*3/uL (ref 150–400)
RBC: 2.6 MIL/uL — ABNORMAL LOW (ref 4.22–5.81)
RDW: 19.4 % — ABNORMAL HIGH (ref 11.5–15.5)
WBC: 7.4 10*3/uL (ref 4.0–10.5)

## 2018-01-03 LAB — MAGNESIUM: Magnesium: 1.3 mg/dL — ABNORMAL LOW (ref 1.7–2.4)

## 2018-01-03 NOTE — Progress Notes (Signed)
Pulmonary Critical Care Medicine [facility] [place]   PULMONARY SERVICE  PROGRESS NOTE  Date of Service: 01/03/2018  Rodney Cordova  WUJ:[id number]  DOB: 06-Aug-1941   DOA: 12/16/2017  Referring Physician: Carron Curie, MD  HPI: Rodney Cordova is a 77 y.o. male seen for follow up of Acute on Chronic Respiratory Failure.  Remains on full vent support has not been tolerating any attempts at weaning.  Patient was started on cefepime and vancomycin because of the findings of the chest x-ray and also blood cultures.  Spoke with the wife yesterday regarding the possibility of doing a bronchoscopy if there is no improvement radiologically or clinically.  I also spoke with primary care and they are in agreement  Medications: Reviewed on Rounds  Physical Exam:  Vitals: Temperature 99.5 pulse 98 respiratory rate 27 blood pressure 128/65 saturations 96%  Ventilator Settings mode of ventilation assist control FiO2 20% tidal volume 500 respiratory rate 16 PEEP 5  . General: Comfortable at this time . Eyes: Grossly normal lids, irises & conjunctiva . ENT: grossly tongue is normal . Neck: no obvious mass . Cardiovascular: S1-S2 normal no gallop . Respiratory: Good air entry no rhonchi expansion is equal . Abdomen: Soft nondistended . Skin: no rash seen on limited exam . Musculoskeletal: not rigid . Psychiatric:unable to assess . Neurologic: no seizure no involuntary movements         Labs on Admission:  Basic Metabolic Panel: Recent Labs  Lab 12/28/17 0838 12/29/17 0819 12/30/17 0339 01/01/18 0939 01/02/18 0719  NA 153* 148* 148* 142 140  K 3.2* 2.8* 3.3* 2.9* 3.6  CL 122* 122* 119* 109 109  CO2 22 23 23 23 25   GLUCOSE 185* 163* 170* 187* 172*  BUN 92* 82* 71* 42* 34*  CREATININE 2.68* 2.55* 2.26* 1.64* 1.49*  CALCIUM 7.2* 7.3* 7.4* 7.4* 7.3*  MG  --   --   --  1.3* 1.3*    Liver Function Tests: No results for input(s): AST, ALT, ALKPHOS, BILITOT, PROT, ALBUMIN in  the last 168 hours. No results for input(s): LIPASE, AMYLASE in the last 168 hours. No results for input(s): AMMONIA in the last 168 hours.  CBC: Recent Labs  Lab 01/01/18 0939 01/01/18 1850 01/03/18 0621  WBC 7.9 9.0 7.4  HGB 6.8* 8.4* 7.8*  HCT 23.5* 28.3* 25.2*  MCV 99.6 96.3 96.9  PLT 215 203 240    Cardiac Enzymes: No results for input(s): CKTOTAL, CKMB, CKMBINDEX, TROPONINI in the last 168 hours.  BNP (last 3 results) No results for input(s): BNP in the last 8760 hours.  ProBNP (last 3 results) No results for input(s): PROBNP in the last 8760 hours.  Radiological Exams on Admission: Ct Chest Wo Contrast  Addendum Date: 01/02/2018   ADDENDUM REPORT: 01/02/2018 09:03 ADDENDUM: As mentioned in the body of the report, but not included in the original impressions section, there is a displaced fracture of the mid sternum. This may simply represent a healing fracture, however, the appearance of the fracture raises concern for potential underlying osseous lesion (i.e., pathologic fracture). Clinical correlation for history of recent trauma the could explain this finding is recommended. In the absence of such history, further evaluation to exclude occult malignancy should be considered. Electronically Signed   By: Trudie Reed M.D.   On: 01/02/2018 09:03   Result Date: 01/02/2018 CLINICAL DATA:  77 year old male with history of multifocal pneumonia. History of pulmonary emboli. Acute respiratory failure with hypoxia. Deep venous thrombosis. EXAM: CT CHEST  WITHOUT CONTRAST TECHNIQUE: Multidetector CT imaging of the chest was performed following the standard protocol without IV contrast. COMPARISON:  No priors. FINDINGS: Cardiovascular: Heart size is normal. There is no significant pericardial fluid, thickening or pericardial calcification. Atherosclerosis of the thoracic aorta with ectasia of ascending thoracic aorta which measures up to 4.2 cm in diameter. No definite coronary artery  calcifications. Mediastinum/Nodes: No pathologically enlarged mediastinal or hilar lymph nodes. Please note that accurate exclusion of hilar adenopathy is limited on noncontrast CT scans. Nasogastric tube extending into the stomach. Esophagus is otherwise unremarkable in appearance. No axillary lymphadenopathy. Tracheostomy tube in position with tip in the upper trachea. Lungs/Pleura: Extensive respiratory motion limits assessment for smaller pulmonary nodules. No large pulmonary nodule or mass is confidently identified. A few scattered calcified pulmonary nodules are noted in the right lung, compatible with calcified granulomas. No acute consolidative airspace disease is noted. There are widespread areas of septal thickening, mild cylindrical bronchiectasis, peripheral bronchiolectasis and what appears to be honeycombing scattered throughout the lungs bilaterally, with a mild craniocaudal gradient, suspicious for interstitial lung disease such as usual interstitial pneumonia. Small bilateral pleural effusions. Upper Abdomen: Pigtail drainage catheter in the right upper quadrant in a subhepatic location, similar to the prior examination. Status post cholecystectomy. Tip of an IVC filter is noted, but incompletely imaged. Musculoskeletal: There is a displaced fracture of the mid sternum (1 shaft width of anterior displacement of the distal fracture fragment) with destructive bony changes adjacent to the fracture, which could simply be reflective of normal osseous healing, however, the possibility of an underlying lesion (i.e., a pathologic fracture) is not excluded. IMPRESSION: 1. Small bilateral pleural effusions. 2. No evidence to suggest multilobar pneumonia. 3. There is a spectrum of findings in the lungs suggestive of interstitial lung disease, potentially usual interstitial pneumonia, as discussed above. Further evaluation with nonemergent high-resolution chest CT is recommended after resolution of the  patient's acute illness to better evaluate these findings. 4. 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 5. Additional incidental findings, as above. Aortic Atherosclerosis (ICD10-I70.0). Electronically Signed: By: Trudie Reedaniel  Entrikin M.D. On: 01/02/2018 08:49   Dg Chest Port 1 View  Result Date: 01/01/2018 CLINICAL DATA:  Fever and increased shortness of breath today EXAM: PORTABLE CHEST 1 VIEW COMPARISON:  Portable exam 1004 hours compared to 12/26/2017 FINDINGS: Tip of tracheostomy tube projects 5.5 cm above carina. Nasogastric tube extends into stomach. Normal heart size, mediastinal contours, and pulmonary vascularity. BILATERAL pulmonary infiltrates, least severe in lower RIGHT lung, most likely representing multifocal pneumonia. No gross pleural effusion or pneumothorax. IMPRESSION: Persistent BILATERAL asymmetric pulmonary infiltrates favoring multifocal pneumonia, little changed. Electronically Signed   By: Ulyses SouthwardMark  Boles M.D.   On: 01/01/2018 10:20   Dg Abd Portable 1v  Result Date: 01/01/2018 CLINICAL DATA:  Fever and increased shortness of breath today. Increased abdominal distension for the past 3 weeks. Assess NG tube position. EXAM: PORTABLE ABDOMEN - 1 VIEW COMPARISON:  Abdominal radiograph of December 21, 2017 FINDINGS: There is a relative paucity of bowel gas. A small amount of gas is noted in the ascending colon. The NG tube tip projects in the region of the pylorus or proximal duodenum. There is a pigtail drainage catheter in the right upper quadrant which is partially visualized today. There is an inferior vena caval filter present. There are surgical clips in the gallbladder fossa with additional surgical clips overlying the right aspect of the sacrum. IMPRESSION: Relative paucity of bowel  gas. No evidence of obstruction or ileus. The NG tube is in reasonable position with the tip projecting in the distal pylorus or proximal duodenum. There is a  drainage catheter in the right upper quadrant, presumably a cholecystostomy tube, which is partially included in the field of view. Electronically Signed   By: David  [place] M.D.   On: 01/01/2018 10:20    Assessment/Plan Active Problems:   Pulmonary emboli (HCC)   Acute DVT (deep venous thrombosis) (HCC)   Postoperative intra-abdominal abscess   Acute on chronic respiratory failure with hypoxia (HCC)   Acute cholecystitis   Lobar pneumonia (HCC)   Essential hypertension   1. Acute on chronic respiratory failure with hypoxia continue with full vent support patient is right now not able to wean we will continue to assess the RSB I and mechanics we will continue with pulmonary toilet supportive care 2. Lobar pneumonia on double antibiotic therapy will continue with cefepime added vancomycin 3. Postoperative intra-abdominal abscess treated we will continue to follow 4. Pulmonary emboli we will continue with present supportive care   I have personally seen and evaluated the patient, evaluated laboratory and imaging results, formulated the assessment and plan and placed orders. The Patient requires high complexity decision making for assessment and support.  Case was discussed on Rounds with the Respiratory Therapy Staff  Yevonne Pax, MD [facility] Pulmonary Critical Care Medicine Sleep Medicine

## 2018-01-04 DIAGNOSIS — T8149XA Infection following a procedure, other surgical site, initial encounter: Secondary | ICD-10-CM | POA: Diagnosis not present

## 2018-01-04 DIAGNOSIS — J9621 Acute and chronic respiratory failure with hypoxia: Secondary | ICD-10-CM | POA: Diagnosis not present

## 2018-01-04 DIAGNOSIS — I2699 Other pulmonary embolism without acute cor pulmonale: Secondary | ICD-10-CM | POA: Diagnosis not present

## 2018-01-04 DIAGNOSIS — J181 Lobar pneumonia, unspecified organism: Secondary | ICD-10-CM | POA: Diagnosis not present

## 2018-01-04 LAB — MAGNESIUM: Magnesium: 1.7 mg/dL (ref 1.7–2.4)

## 2018-01-04 LAB — PROTIME-INR
INR: 1.41
Prothrombin Time: 17.2 seconds — ABNORMAL HIGH (ref 11.4–15.2)

## 2018-01-04 NOTE — Progress Notes (Signed)
[facility]   PULMONARY SERVICE  PROGRESS NOTE  Date of Service: 01/04/2018  Rodney Cordova  ZOX:[id number]  DOB: 10/29/1940   DOA: 12/16/2017  Referring Physician: Carron CurieAli Hijazi, MD  HPI: Rodney Cordova is a 77 y.o. male seen for follow up of Acute on Chronic Respiratory Failure.  Patient is on full support currently on assist control mode has been on 35% oxygen  Medications: Reviewed on Rounds  Physical Exam:  Vitals: Temperature 98.4 pulse 88 respiratory 22 blood pressure 115/62 saturations 94%  Ventilator Settings mode of ventilation assist control FiO2 35% tidal volume 565 PEEP 5  . General: Comfortable at this time . Eyes: Grossly normal lids, irises & conjunctiva . ENT: grossly tongue is normal . Neck: no obvious mass . Cardiovascular: S1-S2 normal no tachycardia . Respiratory: Few scattered rhonchi expansion is equal . Abdomen: Soft nontender . Skin: no rash seen on limited exam . Musculoskeletal: not rigid . Psychiatric:unable to assess . Neurologic: no seizure no involuntary movements         Labs on Admission:  Basic Metabolic Panel: Recent Labs  Lab 12/29/17 0819 12/30/17 0339 01/01/18 0939 01/02/18 0719 01/03/18 0621  NA 148* 148* 142 140  --   K 2.8* 3.3* 2.9* 3.6  --   CL 122* 119* 109 109  --   CO2 23 23 23 25   --   GLUCOSE 163* 170* 187* 172*  --   BUN 82* 71* 42* 34*  --   CREATININE 2.55* 2.26* 1.64* 1.49*  --   CALCIUM 7.3* 7.4* 7.4* 7.3*  --   MG  --   --  1.3* 1.3* 1.3*    Liver Function Tests: No results for input(s): AST, ALT, ALKPHOS, BILITOT, PROT, ALBUMIN in the last 168 hours. No results for input(s): LIPASE, AMYLASE in the last 168 hours. No results for input(s): AMMONIA in the last 168 hours.  CBC: Recent Labs  Lab 01/01/18 0939 01/01/18 1850 01/03/18 0621  WBC 7.9 9.0 7.4  HGB 6.8* 8.4* 7.8*  HCT 23.5* 28.3* 25.2*  MCV 99.6 96.3 96.9  PLT 215 203 240    Cardiac  Enzymes: No results for input(s): CKTOTAL, CKMB, CKMBINDEX, TROPONINI in the last 168 hours.  BNP (last 3 results) No results for input(s): BNP in the last 8760 hours.  ProBNP (last 3 results) No results for input(s): PROBNP in the last 8760 hours.  Radiological Exams on Admission: Ct Chest Wo Contrast  Addendum Date: 01/02/2018   ADDENDUM REPORT: 01/02/2018 09:03 ADDENDUM: As mentioned in the body of the report, but not included in the original impressions section, there is a displaced fracture of the mid sternum. This may simply represent a healing fracture, however, the appearance of the fracture raises concern for potential underlying osseous lesion (i.e., pathologic fracture). Clinical correlation for history of recent trauma the could explain this finding is recommended. In the absence of such history, further evaluation to exclude occult malignancy should be considered. Electronically Signed   By: Trudie Reedaniel  Entrikin M.D.   On: 01/02/2018 09:03   Result Date: 01/02/2018 CLINICAL DATA:  77 year old male with history of multifocal pneumonia. History of pulmonary emboli. Acute respiratory failure with hypoxia. Deep venous thrombosis. EXAM: CT CHEST WITHOUT CONTRAST TECHNIQUE: Multidetector CT imaging of the chest was performed following the standard protocol without IV contrast. COMPARISON:  No priors. FINDINGS: Cardiovascular: Heart size is normal. There is no significant pericardial fluid, thickening or pericardial calcification. Atherosclerosis of the thoracic aorta  with ectasia of ascending thoracic aorta which measures up to 4.2 cm in diameter. No definite coronary artery calcifications. Mediastinum/Nodes: No pathologically enlarged mediastinal or hilar lymph nodes. Please note that accurate exclusion of hilar adenopathy is limited on noncontrast CT scans. Nasogastric tube extending into the stomach. Esophagus is otherwise unremarkable in appearance. No axillary lymphadenopathy. Tracheostomy tube  in position with tip in the upper trachea. Lungs/Pleura: Extensive respiratory motion limits assessment for smaller pulmonary nodules. No large pulmonary nodule or mass is confidently identified. A few scattered calcified pulmonary nodules are noted in the right lung, compatible with calcified granulomas. No acute consolidative airspace disease is noted. There are widespread areas of septal thickening, mild cylindrical bronchiectasis, peripheral bronchiolectasis and what appears to be honeycombing scattered throughout the lungs bilaterally, with a mild craniocaudal gradient, suspicious for interstitial lung disease such as usual interstitial pneumonia. Small bilateral pleural effusions. Upper Abdomen: Pigtail drainage catheter in the right upper quadrant in a subhepatic location, similar to the prior examination. Status post cholecystectomy. Tip of an IVC filter is noted, but incompletely imaged. Musculoskeletal: There is a displaced fracture of the mid sternum (1 shaft width of anterior displacement of the distal fracture fragment) with destructive bony changes adjacent to the fracture, which could simply be reflective of normal osseous healing, however, the possibility of an underlying lesion (i.e., a pathologic fracture) is not excluded. IMPRESSION: 1. Small bilateral pleural effusions. 2. No evidence to suggest multilobar pneumonia. 3. There is a spectrum of findings in the lungs suggestive of interstitial lung disease, potentially usual interstitial pneumonia, as discussed above. Further evaluation with nonemergent high-resolution chest CT is recommended after resolution of the patient's acute illness to better evaluate these findings. 4. 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 5. Additional incidental findings, as above. Aortic Atherosclerosis (ICD10-I70.0). Electronically Signed: By: Trudie Reed M.D. On: 01/02/2018 08:49   Dg Chest Port 1 View  Result  Date: 01/01/2018 CLINICAL DATA:  Fever and increased shortness of breath today EXAM: PORTABLE CHEST 1 VIEW COMPARISON:  Portable exam 1004 hours compared to 12/26/2017 FINDINGS: Tip of tracheostomy tube projects 5.5 cm above carina. Nasogastric tube extends into stomach. Normal heart size, mediastinal contours, and pulmonary vascularity. BILATERAL pulmonary infiltrates, least severe in lower RIGHT lung, most likely representing multifocal pneumonia. No gross pleural effusion or pneumothorax. IMPRESSION: Persistent BILATERAL asymmetric pulmonary infiltrates favoring multifocal pneumonia, little changed. Electronically Signed   By: Ulyses Southward M.D.   On: 01/01/2018 10:20   Dg Abd Portable 1v  Result Date: 01/01/2018 CLINICAL DATA:  Fever and increased shortness of breath today. Increased abdominal distension for the past 3 weeks. Assess NG tube position. EXAM: PORTABLE ABDOMEN - 1 VIEW COMPARISON:  Abdominal radiograph of December 21, 2017 FINDINGS: There is a relative paucity of bowel gas. A small amount of gas is noted in the ascending colon. The NG tube tip projects in the region of the pylorus or proximal duodenum. There is a pigtail drainage catheter in the right upper quadrant which is partially visualized today. There is an inferior vena caval filter present. There are surgical clips in the gallbladder fossa with additional surgical clips overlying the right aspect of the sacrum. IMPRESSION: Relative paucity of bowel gas. No evidence of obstruction or ileus. The NG tube is in reasonable position with the tip projecting in the distal pylorus or proximal duodenum. There is a drainage catheter in the right upper quadrant, presumably a cholecystostomy tube, which is partially included  in the field of view. Electronically Signed   By: David  [place] M.D.   On: 01/01/2018 10:20    Assessment/Plan Active Problems:   Pulmonary emboli (HCC)   Acute DVT (deep venous thrombosis) (HCC)   Postoperative intra-abdominal  abscess   Acute on chronic respiratory failure with hypoxia (HCC)   Acute cholecystitis   Lobar pneumonia (HCC)   Essential hypertension   1. Acute on chronic respiratory failure with hypoxia will be continued on full vent support. Respiratory mechanics are poor right now patient is not able to wean will titrate oxygen as tolerated 2. Lobar pneumonia patient is now on appropriate antibiotics will be continued if there is no improvement would consider doing a endoscopy for further evaluation 3. Postoperative intra-abdominal abscess treated with antibiotics we will continue to follow 4. Pulmonary embolism we will continue to monitor   I have personally seen and evaluated the patient, evaluated laboratory and imaging results, formulated the assessment and plan and placed orders. The Patient requires high complexity decision making for assessment and support.  Case was discussed on Rounds with the Respiratory Therapy Staff  Yevonne Pax, MD [facility] Pulmonary Critical Care Medicine Sleep Medicine

## 2018-01-05 DIAGNOSIS — J181 Lobar pneumonia, unspecified organism: Secondary | ICD-10-CM | POA: Diagnosis not present

## 2018-01-05 DIAGNOSIS — J9621 Acute and chronic respiratory failure with hypoxia: Secondary | ICD-10-CM | POA: Diagnosis not present

## 2018-01-05 DIAGNOSIS — T8149XA Infection following a procedure, other surgical site, initial encounter: Secondary | ICD-10-CM | POA: Diagnosis not present

## 2018-01-05 DIAGNOSIS — I2699 Other pulmonary embolism without acute cor pulmonale: Secondary | ICD-10-CM | POA: Diagnosis not present

## 2018-01-05 LAB — URINALYSIS, ROUTINE W REFLEX MICROSCOPIC
Bilirubin Urine: NEGATIVE
Glucose, UA: NEGATIVE mg/dL
KETONES UR: NEGATIVE mg/dL
Nitrite: NEGATIVE
PROTEIN: 30 mg/dL — AB
Specific Gravity, Urine: 1.015 (ref 1.005–1.030)
pH: 5 (ref 5.0–8.0)

## 2018-01-05 LAB — CBC
HEMATOCRIT: 27.6 % — AB (ref 39.0–52.0)
HEMOGLOBIN: 8.3 g/dL — AB (ref 13.0–17.0)
MCH: 29 pg (ref 26.0–34.0)
MCHC: 30.1 g/dL (ref 30.0–36.0)
MCV: 96.5 fL (ref 78.0–100.0)
Platelets: 245 10*3/uL (ref 150–400)
RBC: 2.86 MIL/uL — ABNORMAL LOW (ref 4.22–5.81)
RDW: 18.3 % — ABNORMAL HIGH (ref 11.5–15.5)
WBC: 11.3 10*3/uL — ABNORMAL HIGH (ref 4.0–10.5)

## 2018-01-05 LAB — BASIC METABOLIC PANEL
ANION GAP: 9 (ref 5–15)
BUN: 41 mg/dL — ABNORMAL HIGH (ref 6–20)
CO2: 24 mmol/L (ref 22–32)
Calcium: 7.3 mg/dL — ABNORMAL LOW (ref 8.9–10.3)
Chloride: 105 mmol/L (ref 101–111)
Creatinine, Ser: 1.62 mg/dL — ABNORMAL HIGH (ref 0.61–1.24)
GFR calc Af Amer: 46 mL/min — ABNORMAL LOW (ref >60)
GFR calc non Af Amer: 40 mL/min — ABNORMAL LOW (ref >60)
GLUCOSE: 168 mg/dL — AB (ref 65–99)
POTASSIUM: 3.5 mmol/L (ref 3.5–5.1)
Sodium: 138 mmol/L (ref 135–145)

## 2018-01-05 LAB — PROTIME-INR
INR: 1.6
Prothrombin Time: 18.9 seconds — ABNORMAL HIGH (ref 11.4–15.2)

## 2018-01-05 LAB — MAGNESIUM: Magnesium: 1.5 mg/dL — ABNORMAL LOW (ref 1.7–2.4)

## 2018-01-05 MED ORDER — IOPAMIDOL (ISOVUE-300) INJECTION 61%
INTRAVENOUS | Status: AC
  Filled 2018-01-05: qty 100

## 2018-01-05 NOTE — Progress Notes (Signed)
[facility]   PULMONARY SERVICE  PROGRESS NOTE  Date of Service: 01/05/2018  Rodney MainsGrier Filip  ZOX:[id number]  DOB: 11/05/1940   DOA: 12/16/2017  Referring Physician: Carron CurieAli Hijazi, MD  HPI: Rodney Cordova is a 77 y.o. male seen for follow up of Acute on Chronic Respiratory Failure.  Comfortable no distress at this time.  Patient's on 40% oxygen remains in assist-control mode has not been able to do any weaning  Medications: Reviewed on Rounds  Physical Exam:  Vitals:  Temperature 99.4 degrees pulse 113 respiratory rate 21 blood pressure 132/64 saturations 96%  Ventilator Settings  Mode of ventilation assist-control FiO2 40% tidal volume 450 peep 5  . General: Comfortable at this time . Eyes: Grossly normal lids, irises & conjunctiva . ENT: grossly tongue is normal . Neck: no obvious mass . Cardiovascular:  S1-S2 normal no gallop . Respiratory:  No rhonchi expansion is equal . Abdomen:  Soft nontender . Skin: no rash seen on limited exam . Musculoskeletal: not rigid . Psychiatric:unable to assess . Neurologic: no seizure no involuntary movements         Labs on Admission:  Basic Metabolic Panel: Recent Labs  Lab 12/30/17 0339 01/01/18 0939 01/02/18 0719 01/03/18 0621 01/04/18 1012 01/05/18 0831  NA 148* 142 140  --   --  138  K 3.3* 2.9* 3.6  --   --  3.5  CL 119* 109 109  --   --  105  CO2 23 23 25   --   --  24  GLUCOSE 170* 187* 172*  --   --  168*  BUN 71* 42* 34*  --   --  41*  CREATININE 2.26* 1.64* 1.49*  --   --  1.62*  CALCIUM 7.4* 7.4* 7.3*  --   --  7.3*  MG  --  1.3* 1.3* 1.3* 1.7 1.5*    Liver Function Tests: No results for input(s): AST, ALT, ALKPHOS, BILITOT, PROT, ALBUMIN in the last 168 hours. No results for input(s): LIPASE, AMYLASE in the last 168 hours. No results for input(s): AMMONIA in the last 168 hours.  CBC: Recent Labs  Lab 01/01/18 0939 01/01/18 1850 01/03/18 0621 01/05/18 0831   WBC 7.9 9.0 7.4 11.3*  HGB 6.8* 8.4* 7.8* 8.3*  HCT 23.5* 28.3* 25.2* 27.6*  MCV 99.6 96.3 96.9 96.5  PLT 215 203 240 245    Cardiac Enzymes: No results for input(s): CKTOTAL, CKMB, CKMBINDEX, TROPONINI in the last 168 hours.  BNP (last 3 results) No results for input(s): BNP in the last 8760 hours.  ProBNP (last 3 results) No results for input(s): PROBNP in the last 8760 hours.  Radiological Exams on Admission: Ct Chest Wo Contrast  Addendum Date: 01/02/2018   ADDENDUM REPORT: 01/02/2018 09:03 ADDENDUM: As mentioned in the body of the report, but not included in the original impressions section, there is a displaced fracture of the mid sternum. This may simply represent a healing fracture, however, the appearance of the fracture raises concern for potential underlying osseous lesion (i.e., pathologic fracture). Clinical correlation for history of recent trauma the could explain this finding is recommended. In the absence of such history, further evaluation to exclude occult malignancy should be considered. Electronically Signed   By: Trudie Reedaniel  Entrikin M.D.   On: 01/02/2018 09:03   Result Date: 01/02/2018 CLINICAL DATA:  77 year old male with history of multifocal pneumonia. History of pulmonary emboli. Acute respiratory failure with hypoxia. Deep venous thrombosis. EXAM:  CT CHEST WITHOUT CONTRAST TECHNIQUE: Multidetector CT imaging of the chest was performed following the standard protocol without IV contrast. COMPARISON:  No priors. FINDINGS: Cardiovascular: Heart size is normal. There is no significant pericardial fluid, thickening or pericardial calcification. Atherosclerosis of the thoracic aorta with ectasia of ascending thoracic aorta which measures up to 4.2 cm in diameter. No definite coronary artery calcifications. Mediastinum/Nodes: No pathologically enlarged mediastinal or hilar lymph nodes. Please note that accurate exclusion of hilar adenopathy is limited on noncontrast CT scans.  Nasogastric tube extending into the stomach. Esophagus is otherwise unremarkable in appearance. No axillary lymphadenopathy. Tracheostomy tube in position with tip in the upper trachea. Lungs/Pleura: Extensive respiratory motion limits assessment for smaller pulmonary nodules. No large pulmonary nodule or mass is confidently identified. A few scattered calcified pulmonary nodules are noted in the right lung, compatible with calcified granulomas. No acute consolidative airspace disease is noted. There are widespread areas of septal thickening, mild cylindrical bronchiectasis, peripheral bronchiolectasis and what appears to be honeycombing scattered throughout the lungs bilaterally, with a mild craniocaudal gradient, suspicious for interstitial lung disease such as usual interstitial pneumonia. Small bilateral pleural effusions. Upper Abdomen: Pigtail drainage catheter in the right upper quadrant in a subhepatic location, similar to the prior examination. Status post cholecystectomy. Tip of an IVC filter is noted, but incompletely imaged. Musculoskeletal: There is a displaced fracture of the mid sternum (1 shaft width of anterior displacement of the distal fracture fragment) with destructive bony changes adjacent to the fracture, which could simply be reflective of normal osseous healing, however, the possibility of an underlying lesion (i.e., a pathologic fracture) is not excluded. IMPRESSION: 1. Small bilateral pleural effusions. 2. No evidence to suggest multilobar pneumonia. 3. There is a spectrum of findings in the lungs suggestive of interstitial lung disease, potentially usual interstitial pneumonia, as discussed above. Further evaluation with nonemergent high-resolution chest CT is recommended after resolution of the patient's acute illness to better evaluate these findings. 4. 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 5. Additional incidental findings, as above.  Aortic Atherosclerosis (ICD10-I70.0). Electronically Signed: By: Trudie Reed M.D. On: 01/02/2018 08:49    Assessment/Plan Active Problems:   Pulmonary emboli (HCC)   Acute DVT (deep venous thrombosis) (HCC)   Postoperative intra-abdominal abscess   Acute on chronic respiratory failure with hypoxia (HCC)   Acute cholecystitis   Lobar pneumonia (HCC)   Essential hypertension   1.  acute on chronic Respiratory failure with hypoxia right now not weanable will continue with full support continue on assist-control mode titrate oxygen as tolerated we will continue supportive care 2. Postoperative intra-abdominal abscess has been treated 3. Pulmonary emboli treated will continue to monitor  4. Multilobar pneumonia we will continue with supportive care continue antibiotics   I have personally seen and evaluated the patient, evaluated laboratory and imaging results, formulated the assessment and plan and placed orders. The Patient requires high complexity decision making for assessment and support.  Case was discussed on Rounds with the Respiratory Therapy Staff  Yevonne Pax, MD [facility] Pulmonary Critical Care Medicine Sleep Medicine

## 2018-01-06 ENCOUNTER — Other Ambulatory Visit (HOSPITAL_COMMUNITY): Payer: Medicare Other

## 2018-01-06 ENCOUNTER — Encounter: Payer: Self-pay | Admitting: Radiology

## 2018-01-06 LAB — URINE CULTURE: Culture: >=100000 — AB

## 2018-01-06 LAB — PROTIME-INR
INR: 1.59
Prothrombin Time: 18.9 seconds — ABNORMAL HIGH (ref 11.4–15.2)

## 2018-01-06 LAB — VANCOMYCIN, TROUGH: Vancomycin Tr: 19 ug/mL (ref 15–20)

## 2018-01-06 MED ORDER — IOPAMIDOL (ISOVUE-M 300) INJECTION 61%: 15.0000 mL | Freq: Once | INTRAMUSCULAR | Status: DC | PRN

## 2018-01-06 MED ORDER — IOPAMIDOL (ISOVUE-300) INJECTION 61%
INTRAVENOUS | Status: AC
  Administered 2018-01-06: 100 mL
  Filled 2018-01-06: qty 30

## 2018-01-07 DIAGNOSIS — T8149XA Infection following a procedure, other surgical site, initial encounter: Secondary | ICD-10-CM | POA: Diagnosis not present

## 2018-01-07 DIAGNOSIS — I2699 Other pulmonary embolism without acute cor pulmonale: Secondary | ICD-10-CM | POA: Diagnosis not present

## 2018-01-07 DIAGNOSIS — J9621 Acute and chronic respiratory failure with hypoxia: Secondary | ICD-10-CM | POA: Diagnosis not present

## 2018-01-07 DIAGNOSIS — J181 Lobar pneumonia, unspecified organism: Secondary | ICD-10-CM | POA: Diagnosis not present

## 2018-01-07 LAB — CBC WITH DIFFERENTIAL/PLATELET
BASOS ABS: 0 10*3/uL (ref 0.0–0.1)
Basophils Relative: 0 %
Eosinophils Absolute: 0.2 10*3/uL (ref 0.0–0.7)
Eosinophils Relative: 2 %
HEMATOCRIT: 26.2 % — AB (ref 39.0–52.0)
Hemoglobin: 7.9 g/dL — ABNORMAL LOW (ref 13.0–17.0)
LYMPHS ABS: 2.2 10*3/uL (ref 0.7–4.0)
LYMPHS PCT: 19 %
MCH: 28.3 pg (ref 26.0–34.0)
MCHC: 30.2 g/dL (ref 30.0–36.0)
MCV: 93.9 fL (ref 78.0–100.0)
MONO ABS: 0.3 10*3/uL (ref 0.1–1.0)
MONOS PCT: 3 %
NEUTROS ABS: 9.2 10*3/uL — AB (ref 1.7–7.7)
Neutrophils Relative %: 76 %
Platelets: 222 10*3/uL (ref 150–400)
RBC: 2.79 MIL/uL — ABNORMAL LOW (ref 4.22–5.81)
RDW: 17.5 % — AB (ref 11.5–15.5)
WBC: 12 10*3/uL — ABNORMAL HIGH (ref 4.0–10.5)

## 2018-01-07 LAB — MAGNESIUM: Magnesium: 2.1 mg/dL (ref 1.7–2.4)

## 2018-01-07 LAB — BASIC METABOLIC PANEL
Anion gap: 9 (ref 5–15)
BUN: 66 mg/dL — ABNORMAL HIGH (ref 6–20)
CHLORIDE: 99 mmol/L — AB (ref 101–111)
CO2: 23 mmol/L (ref 22–32)
Calcium: 7.2 mg/dL — ABNORMAL LOW (ref 8.9–10.3)
Creatinine, Ser: 2.23 mg/dL — ABNORMAL HIGH (ref 0.61–1.24)
GFR calc non Af Amer: 27 mL/min — ABNORMAL LOW (ref >60)
GFR, EST AFRICAN AMERICAN: 31 mL/min — AB (ref >60)
GLUCOSE: 156 mg/dL — AB (ref 65–99)
Potassium: 4.2 mmol/L (ref 3.5–5.1)
Sodium: 131 mmol/L — ABNORMAL LOW (ref 135–145)

## 2018-01-07 LAB — PROTIME-INR
INR: 1.7
Prothrombin Time: 19.8 seconds — ABNORMAL HIGH (ref 11.4–15.2)

## 2018-01-07 NOTE — Progress Notes (Signed)
[facility]   PULMONARY SERVICE  PROGRESS NOTE  Date of Service: 01/07/2018  Rodney Cordova [place]  RUE:[id number]  DOB: 04/10/1941   DOA: 12/16/2017  Referring Physician: Carron CurieAli Hijazi, MD  HPI: Rodney Cordova [place] is a 77 y.o. male seen for follow up of Acute on Chronic Respiratory Failure.  Comfortable without distress remains on the ventilator. 4.  Patient had a CT of the abdomen done yesterday which was not remarkable.  Right now patient's temperatures are down  Medications: Reviewed on Rounds  Physical Exam:  Vitals: Temperature 98.7 pulse 66 respiratory rate 28 blood pressure 95/40 saturations 98%  Ventilator Settings mode of ventilation assist control FiO2 35% tidal volume 526 PEEP 5  . General: Comfortable at this time . Eyes: Grossly normal lids, irises & conjunctiva . ENT: grossly tongue is normal . Neck: no obvious mass . Cardiovascular: S1-S2 normal no gallop . Respiratory: No rhonchi expansion is equal . Abdomen: Soft nontender . Skin: no rash seen on limited exam . Musculoskeletal: not rigid . Psychiatric:unable to assess . Neurologic: no seizure no involuntary movements         Labs on Admission:  Basic Metabolic Panel: Recent Labs  Lab 01/01/18 0939 01/02/18 0719 01/03/18 0621 01/04/18 1012 01/05/18 0831 01/07/18 0543  NA 142 140  --   --  138 131*  K 2.9* 3.6  --   --  3.5 4.2  CL 109 109  --   --  105 99*  CO2 23 25  --   --  24 23  GLUCOSE 187* 172*  --   --  168* 156*  BUN 42* 34*  --   --  41* 66*  CREATININE 1.64* 1.49*  --   --  1.62* 2.23*  CALCIUM 7.4* 7.3*  --   --  7.3* 7.2*  MG 1.3* 1.3* 1.3* 1.7 1.5* 2.1    Liver Function Tests: No results for input(s): AST, ALT, ALKPHOS, BILITOT, PROT, ALBUMIN in the last 168 hours. No results for input(s): LIPASE, AMYLASE in the last 168 hours. No results for input(s): AMMONIA in the last 168 hours.  CBC: Recent Labs  Lab 01/01/18 0939 01/01/18 1850  01/03/18 0621 01/05/18 0831 01/07/18 0543  WBC 7.9 9.0 7.4 11.3* 12.0*  NEUTROABS  --   --   --   --  9.2*  HGB 6.8* 8.4* 7.8* 8.3* 7.9*  HCT 23.5* 28.3* 25.2* 27.6* 26.2*  MCV 99.6 96.3 96.9 96.5 93.9  PLT 215 203 240 245 222    Cardiac Enzymes: No results for input(s): CKTOTAL, CKMB, CKMBINDEX, TROPONINI in the last 168 hours.  BNP (last 3 results) No results for input(s): BNP in the last 8760 hours.  ProBNP (last 3 results) No results for input(s): PROBNP in the last 8760 hours.  Radiological Exams on Admission: Ct Abdomen Pelvis W Contrast  Result Date: 01/06/2018 CLINICAL DATA:  Gangrenous gallbladder abscess, continued fever 101 degrees EXAM: CT ABDOMEN AND PELVIS WITH CONTRAST TECHNIQUE: Multidetector CT imaging of the abdomen and pelvis was performed using the standard protocol following bolus administration of intravenous contrast. Sagittal and coronal MPR images reconstructed from axial data set. CONTRAST:  100 mL ISOVUE-300 IOPAMIDOL (ISOVUE-300) INJECTION 61% IV. Dilute oral contrast. COMPARISON:  12/17/2017 FINDINGS: Lower chest: Bibasilar atelectasis and tiny RIGHT pleural effusion Hepatobiliary: Gallbladder surgically absent. Abscess drain again identified at the inferior RIGHT lobe liver with minimal residual tissue thickening/chronic collection along the inner border of the RIGHT lateral abdominal wall,  slightly decreased. No focal hepatic lesions. Pancreas: Normal appearance Spleen: Normal appearance Adrenals/Urinary Tract: Adrenal glands, kidneys, ureters, and bladder normal appearance Stomach/Bowel: Resolution of ascending/transverse colonic wall thickening seen on previous exam. Tip of nasogastric tube is at the duodenal bulb. Sigmoid diverticulosis without evidence of diverticulitis. Fluid-filled rectum. Stomach and bowel loops otherwise normal appearance. Vascular/Lymphatic: Atherosclerotic calcifications aorta without aneurysm. IVC filter with tip at the level of the  renal veins. No adenopathy. Reproductive: Unremarkable Other: BILATERAL inguinal hernias containing fat. No free air or free fluid. No inflammatory process. Musculoskeletal: Bones demineralized. Chronic compression deformity of the superior endplate of L1. Scattered degenerative disc disease changes lumbar spine. IMPRESSION: Sub appendix RIGHT upper quadrant abscess drain with slight decrease in thickening/fluid inferior to the drain along the inner surface of the RIGHT lateral abdominal wall. No new abscess collections identified. Sigmoid diverticulosis without diverticulitis changes. Small BILATERAL inguinal hernias containing fat. No new intra-abdominal or intrapelvic abnormalities. Electronically Signed   By: Ulyses Southward M.D.   On: 01/06/2018 12:26    Assessment/Plan Active Problems:   Pulmonary emboli (HCC)   Acute DVT (deep venous thrombosis) (HCC)   Postoperative intra-abdominal abscess   Acute on chronic respiratory failure with hypoxia (HCC)   Acute cholecystitis   Lobar pneumonia (HCC)   Essential hypertension   1. Acute on chronic respiratory failure with hypoxia not wean about this time we will continue with full support continue on assist control mode titrate oxygen as tolerated continue with pulmonary toilet 2. Pulmonary emboli treated we will continue to monitor 3. Postoperative intra-abdominal abscess actually showed a little bit of improvement on the CT scan 4. Lobar pneumonia treated with antibiotics we will continue to follow   I have personally seen and evaluated the patient, evaluated laboratory and imaging results, formulated the assessment and plan and placed orders. The Patient requires high complexity decision making for assessment and support.  Case was discussed on Rounds with the Respiratory Therapy Staff  Yevonne Pax, MD [facility] Pulmonary Critical Care Medicine Sleep Medicine

## 2018-01-08 ENCOUNTER — Other Ambulatory Visit (HOSPITAL_COMMUNITY): Payer: Medicare Other

## 2018-01-08 DIAGNOSIS — J9621 Acute and chronic respiratory failure with hypoxia: Secondary | ICD-10-CM | POA: Diagnosis not present

## 2018-01-08 DIAGNOSIS — T8149XA Infection following a procedure, other surgical site, initial encounter: Secondary | ICD-10-CM | POA: Diagnosis not present

## 2018-01-08 DIAGNOSIS — I2699 Other pulmonary embolism without acute cor pulmonale: Secondary | ICD-10-CM | POA: Diagnosis not present

## 2018-01-08 DIAGNOSIS — J181 Lobar pneumonia, unspecified organism: Secondary | ICD-10-CM | POA: Diagnosis not present

## 2018-01-08 LAB — PROTIME-INR
INR: 1.85
Prothrombin Time: 21.2 seconds — ABNORMAL HIGH (ref 11.4–15.2)

## 2018-01-08 NOTE — Progress Notes (Signed)
Pulmonary Critical Care Medicine [facility] [place]   PULMONARY SERVICE  PROGRESS NOTE  Date of Service: 01/08/2018  Rodney Cordova  ZOX:[id number]  DOB: 1941-01-07   DOA: 12/16/2017  Referring Physician: Carron Curie, MD  HPI: Rodney Cordova is a 77 y.o. male seen for follow up of Acute on Chronic Respiratory Failure.  On full vent support.  Patient's fevers have come down.  Remains comfortable without distress  Medications: Reviewed on Rounds  Physical Exam:  Vitals: Temperature 96.8 pulse 102 respiratory rate 31 blood pressure 101/42 saturations 99%  Ventilator Settings mode of ventilation assist control FiO2 35% tidal volume 570 PEEP 5  . General: Comfortable at this time . Eyes: Grossly normal lids, irises & conjunctiva . ENT: grossly tongue is normal . Neck: no obvious mass . Cardiovascular: S1-S2 normal no gallop . Respiratory: No rhonchi expansion equal . Abdomen: Soft nontender . Skin: no rash seen on limited exam . Musculoskeletal: not rigid . Psychiatric:unable to assess . Neurologic: no seizure no involuntary movements         Labs on Admission:  Basic Metabolic Panel: Recent Labs  Lab 01/02/18 0719 01/03/18 0621 01/04/18 1012 01/05/18 0831 01/07/18 0543  NA 140  --   --  138 131*  K 3.6  --   --  3.5 4.2  CL 109  --   --  105 99*  CO2 25  --   --  24 23  GLUCOSE 172*  --   --  168* 156*  BUN 34*  --   --  41* 66*  CREATININE 1.49*  --   --  1.62* 2.23*  CALCIUM 7.3*  --   --  7.3* 7.2*  MG 1.3* 1.3* 1.7 1.5* 2.1    Liver Function Tests: No results for input(s): AST, ALT, ALKPHOS, BILITOT, PROT, ALBUMIN in the last 168 hours. No results for input(s): LIPASE, AMYLASE in the last 168 hours. No results for input(s): AMMONIA in the last 168 hours.  CBC: Recent Labs  Lab 01/01/18 1850 01/03/18 0621 01/05/18 0831 01/07/18 0543  WBC 9.0 7.4 11.3* 12.0*  NEUTROABS  --   --   --  9.2*  HGB 8.4* 7.8* 8.3* 7.9*  HCT 28.3* 25.2* 27.6*  26.2*  MCV 96.3 96.9 96.5 93.9  PLT 203 240 245 222    Cardiac Enzymes: No results for input(s): CKTOTAL, CKMB, CKMBINDEX, TROPONINI in the last 168 hours.  BNP (last 3 results) No results for input(s): BNP in the last 8760 hours.  ProBNP (last 3 results) No results for input(s): PROBNP in the last 8760 hours.  Radiological Exams on Admission: Ct Abdomen Pelvis W Contrast  Result Date: 01/06/2018 CLINICAL DATA:  Gangrenous gallbladder abscess, continued fever 101 degrees EXAM: CT ABDOMEN AND PELVIS WITH CONTRAST TECHNIQUE: Multidetector CT imaging of the abdomen and pelvis was performed using the standard protocol following bolus administration of intravenous contrast. Sagittal and coronal MPR images reconstructed from axial data set. CONTRAST:  100 mL ISOVUE-300 IOPAMIDOL (ISOVUE-300) INJECTION 61% IV. Dilute oral contrast. COMPARISON:  12/17/2017 FINDINGS: Lower chest: Bibasilar atelectasis and tiny RIGHT pleural effusion Hepatobiliary: Gallbladder surgically absent. Abscess drain again identified at the inferior RIGHT lobe liver with minimal residual tissue thickening/chronic collection along the inner border of the RIGHT lateral abdominal wall, slightly decreased. No focal hepatic lesions. Pancreas: Normal appearance Spleen: Normal appearance Adrenals/Urinary Tract: Adrenal glands, kidneys, ureters, and bladder normal appearance Stomach/Bowel: Resolution of ascending/transverse colonic wall thickening seen on previous exam. Tip of nasogastric  tube is at the duodenal bulb. Sigmoid diverticulosis without evidence of diverticulitis. Fluid-filled rectum. Stomach and bowel loops otherwise normal appearance. Vascular/Lymphatic: Atherosclerotic calcifications aorta without aneurysm. IVC filter with tip at the level of the renal veins. No adenopathy. Reproductive: Unremarkable Other: BILATERAL inguinal hernias containing fat. No free air or free fluid. No inflammatory process. Musculoskeletal: Bones  demineralized. Chronic compression deformity of the superior endplate of L1. Scattered degenerative disc disease changes lumbar spine. IMPRESSION: Sub appendix RIGHT upper quadrant abscess drain with slight decrease in thickening/fluid inferior to the drain along the inner surface of the RIGHT lateral abdominal wall. No new abscess collections identified. Sigmoid diverticulosis without diverticulitis changes. Small BILATERAL inguinal hernias containing fat. No new intra-abdominal or intrapelvic abnormalities. Electronically Signed   By: Ulyses SouthwardMark  Boles M.D.   On: 01/06/2018 12:26    Assessment/Plan Active Problems:   Pulmonary emboli (HCC)   Acute DVT (deep venous thrombosis) (HCC)   Postoperative intra-abdominal abscess   Acute on chronic respiratory failure with hypoxia (HCC)   Acute cholecystitis   Lobar pneumonia (HCC)   Essential hypertension   1. Acute on chronic respiratory failure with hypoxia continue with full vent support at this time patient is not wean we will continue with pulmonary toilet secretion management follow along 2. Intra-abdominal abscess treated fevers are coming down last CT shows some improvement 3. Lobar pneumonia on antibiotics seems to be improving clinically 4. Pulmonary emboli treated we will continue to follow   I have personally seen and evaluated the patient, evaluated laboratory and imaging results, formulated the assessment and plan and placed orders. The Patient requires high complexity decision making for assessment and support.  Case was discussed on Rounds with the Respiratory Therapy Staff  Yevonne PaxSaadat A Mercedes Valeriano, MD [facility] Pulmonary Critical Care Medicine Sleep Medicine

## 2018-01-09 DIAGNOSIS — T8149XA Infection following a procedure, other surgical site, initial encounter: Secondary | ICD-10-CM | POA: Diagnosis not present

## 2018-01-09 DIAGNOSIS — J181 Lobar pneumonia, unspecified organism: Secondary | ICD-10-CM | POA: Diagnosis not present

## 2018-01-09 DIAGNOSIS — J9621 Acute and chronic respiratory failure with hypoxia: Secondary | ICD-10-CM | POA: Diagnosis not present

## 2018-01-09 DIAGNOSIS — I2699 Other pulmonary embolism without acute cor pulmonale: Secondary | ICD-10-CM | POA: Diagnosis not present

## 2018-01-09 LAB — PROTIME-INR
INR: 1.56
Prothrombin Time: 18.5 seconds — ABNORMAL HIGH (ref 11.4–15.2)

## 2018-01-09 NOTE — Consult Note (Signed)
Chief Complaint: Patient was seen in consultation today for percutaneous gastric tube placement at the request of Dr Sharyon Medicus   Supervising Physician: Malachy Moan  Patient Status: Select IP  History of Present Illness: Rodney Cordova is a 77 y.o. 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 No new abscess collections identified. Sigmoid diverticulosis without diverticulitis changes. Small BILATERAL inguinal hernias containing fat. No new intra-abdominal or intrapelvic abnormalities.  Dr Grace Isaac reviewed imaging and has approved pt for G tube at his time Afeb; wbc 12 Dr Grace Isaac has also approved abscess drain injection-- removal if deemed appropriate with IR MD   Past Medical History:  Diagnosis Date  . Acute cholecystitis 12/18/2017  . Acute DVT (deep venous thrombosis) (HCC) 12/18/2017  . Acute on chronic respiratory failure with hypoxia (HCC) 12/18/2017  . Diabetes mellitus without complication (HCC)   . Lobar pneumonia (HCC) 12/18/2017  . Postoperative intra-abdominal abscess 12/18/2017  . Pulmonary emboli (HCC) 12/18/2017    Past Surgical History:  Procedure Laterality Date  . CHOLECYSTECTOMY    . TRACHEOSTOMY TUBE PLACEMENT N/A 12/21/2017   Procedure: TRACHEOSTOMY;  Surgeon: Drema Halon, MD;  Location: [facility];  Service: ENT;  Laterality: N/A;    Allergies: Patient has no allergy information on record.  Medications: Prior to Admission medications   Not on File       Family History  Family history unknown: Yes    Social History   Socioeconomic History  . Marital status: Married    Spouse name: Not on file  . Number of children: Not on file  . Years of education: Not on file  . Highest education level: Not on file  Occupational History  . Not on file  Social Needs  . Financial resource strain: Not on file  . Food insecurity:    Worry: Not on file    Inability: Not on file  . Transportation needs:    Medical: Not on file    Non-medical: Not on file  Tobacco Use  . Smoking status: Smoker, Current Status Unknown  . Smokeless tobacco: Never Used  Substance and Sexual Activity  . Alcohol use: Not Currently  . Drug use: Not Currently  . Sexual activity: Not Currently  Lifestyle  . Physical activity:    Days per week: Not on file    Minutes per session: Not on file  . Stress: Not on file  Relationships  . Social connections:    Talks on phone: Not on file    Gets together: Not on file    Attends religious service: Not on file    Active member of club or organization: Not on file    Attends meetings of clubs or organizations: Not on file    Relationship status: Not on file  Other Topics Concern  . Not on file  Social History Narrative  . Not on file     Review of Systems: A 12 point ROS discussed and pertinent positives are indicated in the HPI above.  All other systems are negative.  Review of Systems  Respiratory:  Trach/vent  Gastrointestinal: Negative for abdominal pain.  Neurological: Positive for weakness.  Psychiatric/Behavioral: Negative for agitation.    Vital Signs: There were no vitals taken for this visit.  Physical Exam  Cardiovascular: Normal rate and regular rhythm.  Pulmonary/Chest:  vent  Abdominal: Soft. Bowel sounds are normal.  Skin: Skin is warm and dry.  Psychiatric:  Consented with wife at bedside  Nursing note and vitals reviewed.   Imaging: Ct Abdomen Pelvis Wo  Contrast  Result Date: 12/18/2017 CLINICAL DATA:  77 year old male with a history of prior abdominal drainage EXAM: CT ABDOMEN AND PELVIS WITHOUT CONTRAST TECHNIQUE: Multidetector CT imaging of the abdomen and pelvis was performed following the standard protocol without IV contrast. COMPARISON:  None. FINDINGS: Lower chest: Mixed ground-glass and nodular opacities of the left greater than right lower lobes. Mild ground-glass opacity at the pendant aspects of the right middle lobe. Right-sided pleural effusion. Hepatobiliary: Streak artifact somewhat limits evaluation of the liver parenchyma. Surgical changes of cholecystectomy. Pancreas: Unremarkable appearance of pancreas Spleen: Unremarkable spleen Adrenals/Urinary Tract: Unremarkable appearance of the adrenal glands. No evidence of hydronephrosis or nephrolithiasis. Unremarkable course of the bilateral ureters. Balloon catheter within the urinary bladder. Gas within the urinary bladder, anti dependent aspects. Bladder is relatively decompressed. No significant inflammatory changes. Stomach/Bowel: Gastric tube terminates within the stomach lumen. Small bowel decompressed without abnormal distention or focal wall thickening. No inflammatory changes within the small bowel mesentery. Enteric contrast reaches the distal small bowel/colon. Circumferential wall thickening of the cecum, ascending colon, hepatic flexure. No evidence of transition point. Colonic diverticular disease of the sigmoid colon without associated inflammatory changes. Normal appendix. Vascular/Lymphatic: Minimal calcifications of the aorta. IVC filter in position. Reproductive: Unremarkable appearance of prostate. Other: Bilateral fat containing inguinal hernia. Small fat contai ing umbilical hernia. Pigtail drainage catheter in the subhepatic space. There is trace fluid adjacent to the catheter, tracking caudally within the fascial planes posterior to the pericolic gutter. Musculoskeletal:  Compression fracture of L1 with approximately 50% anterior height loss. Questionable disruption of the superior endplate. No retropulsion of fracture fragments or canal narrowing at the L1 level. Multilevel vacuum disc phenomenon of T11-T12, L1-L2, L4-L5. Advanced facet disease in the lower lumbar levels. 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 IMPRESSION: Pigtail drainage catheter in the right subhepatic space with only small amount of fluid which may represent loculated ascites, or potentially infection. Comparison with any outside imaging may be useful. Circumferential colonic wall thickening involving cecum, ascending colon, hepatic flexure representing nonspecific colitis, potentially infectious or inflammatory. Mixed ground-glass and nodular opacities of the bilateral lung bases, left greater than right. Differential includes multifocal infection, asymmetric edema, and/or chronic changes. Right-sided pleural effusion. L1 compression fracture with questionable disruption of the superior endplate and approximately 50% height loss anteriorly. If there is concern for acute component, MRI recommended as the test of choice to assess for edema. Gas within the urinary bladder, presumably secondary to the indwelling balloon retention catheter. Diverticular disease without evidence of acute diverticulitis. Aortic Atherosclerosis (ICD10-I70.0). Electronically Signed   By: Gilmer Mor D.O.   On: 12/18/2017 08:18   Ct Head Wo Contrast  Result Date: 12/27/2017 CLINICAL DATA:  Altered mental status EXAM: CT HEAD WITHOUT CONTRAST TECHNIQUE: Contiguous axial images were obtained from the base of the skull through the vertex without intravenous contrast. COMPARISON:  None. 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 Mild atrophic changes are seen. No findings to suggest  acute hemorrhage, acute infarction or space-occupying  mass lesion are noted. Vascular: No hyperdense vessel or unexpected calcification. Skull: Normal. Negative for fracture or focal lesion. Sinuses/Orbits: Mild mucosal thickening in the left maxillary antrum is noted. 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 No acute abnormality is noted. Electronically Signed   By: Alcide Clever M.D.   On: 12/27/2017 12:23   Ct Chest Wo Contrast  Addendum Date: 01/02/2018   ADDENDUM REPORT: 01/02/2018 09:03 ADDENDUM: As mentioned in the body of the report, but not included in the original impressions section, there is a displaced fracture of the mid sternum. This may simply represent a healing fracture, however, the appearance of the fracture raises concern for potential underlying osseous lesion (i.e., pathologic fracture). Clinical correlation for history of recent trauma the could explain this finding is recommended. In the absence of such history, further evaluation to exclude occult malignancy should be considered. Electronically Signed   By: Trudie Reed M.D.   On: 01/02/2018 09:03   Result Date: 01/02/2018 CLINICAL DATA:  77 year old male with history of multifocal pneumonia. History of pulmonary emboli. Acute respiratory failure with hypoxia. Deep venous thrombosis. EXAM: CT CHEST WITHOUT CONTRAST TECHNIQUE: Multidetector CT imaging of the chest was performed following the standard protocol without IV contrast. COMPARISON:  No priors. FINDINGS: Cardiovascular: Heart size is normal. There is no significant pericardial fluid, thickening or pericardial calcification. Atherosclerosis of the thoracic aorta with ectasia of ascending thoracic aorta which measures up to 4.2 cm in diameter. No definite coronary artery calcifications. Mediastinum/Nodes: No pathologically enlarged mediastinal or hilar lymph nodes. Please note that accurate exclusion of hilar adenopathy is limited on noncontrast CT scans. Nasogastric  tube extending into the stomach. Esophagus is otherwise unremarkable in appearance. No axillary lymphadenopathy. Tracheostomy tube in position with tip in the upper trachea. Lungs/Pleura: Extensive respiratory motion limits assessment for smaller pulmonary nodules. No large pulmonary nodule or mass is confidently identified. A few scattered calcified pulmonary nodules are noted in the right lung, compatible with calcified granulomas. No acute consolidative airspace disease is noted. There are widespread areas of septal thickening, mild cylindrical bronchiectasis, peripheral bronchiolectasis and what appears to be honeycombing scattered throughout the lungs bilaterally, with a mild craniocaudal gradient, suspicious for interstitial lung disease such as usual interstitial pneumonia. Small bilateral pleural effusions. Upper Abdomen: Pigtail drainage catheter in the right upper quadrant in a subhepatic location, similar to the prior examination. Status post cholecystectomy. Tip of an IVC filter is noted, but incompletely imaged. Musculoskeletal: There is a displaced fracture of the mid sternum (1 shaft width of anterior displacement of the distal fracture fragment) with destructive bony changes adjacent to the fracture, which could simply be reflective of normal osseous healing, however, the possibility of an underlying lesion (i.e., a pathologic fracture) is not excluded. IMPRESSION: 1. Small bilateral pleural effusions. 2. No evidence to suggest multilobar pneumonia. 3. There is a spectrum of findings in the lungs suggestive of interstitial lung disease, potentially usual interstitial pneumonia, as discussed above. Further evaluation with nonemergent high-resolution chest CT is recommended after resolution of the patient's acute illness to better evaluate these findings. 4. 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 5. Additional incidental findings, as above. Aortic  Atherosclerosis (ICD10-I70.0). Electronically Signed: By: Trudie Reed M.D. On: 01/02/2018 08:49   Ct Abdomen Pelvis W Contrast  Result Date: 01/06/2018 CLINICAL DATA:  Gangrenous gallbladder abscess, continued fever 101 degrees  EXAM: CT ABDOMEN AND PELVIS WITH CONTRAST TECHNIQUE: Multidetector CT imaging of the abdomen and pelvis was performed using the standard protocol following bolus administration of intravenous contrast. Sagittal and coronal MPR images reconstructed from axial data set. CONTRAST:  100 mL ISOVUE-300 IOPAMIDOL (ISOVUE-300) INJECTION 61% IV. Dilute oral contrast. COMPARISON:  12/17/2017 FINDINGS: Lower chest: Bibasilar atelectasis and tiny RIGHT pleural effusion Hepatobiliary: Gallbladder surgically absent. Abscess drain again identified at the inferior RIGHT lobe liver with minimal residual tissue thickening/chronic collection along the inner border of the RIGHT lateral abdominal wall, slightly decreased. No focal hepatic lesions. Pancreas: Normal appearance Spleen: Normal appearance Adrenals/Urinary Tract: Adrenal glands, kidneys, ureters, and bladder normal appearance Stomach/Bowel: Resolution of ascending/transverse colonic wall thickening seen on previous exam. Tip of nasogastric tube is at the duodenal bulb. Sigmoid diverticulosis without evidence of diverticulitis. Fluid-filled rectum. Stomach and bowel loops otherwise normal appearance. Vascular/Lymphatic: Atherosclerotic calcifications aorta without aneurysm. IVC filter with tip at the level of the renal veins. No adenopathy. Reproductive: Unremarkable Other: BILATERAL inguinal hernias containing fat. No free air or free fluid. No inflammatory process. Musculoskeletal: Bones demineralized. Chronic compression deformity of the superior endplate of L1. Scattered degenerative disc disease changes lumbar spine. IMPRESSION: Sub appendix RIGHT upper quadrant abscess drain with slight decrease in thickening/fluid inferior to the drain  along the inner surface of the RIGHT lateral abdominal wall. No new abscess collections identified. Sigmoid diverticulosis without diverticulitis changes. Small BILATERAL inguinal hernias containing fat. No new intra-abdominal or intrapelvic abnormalities. Electronically Signed   By: Ulyses SouthwardMark  Boles M.D.   On: 01/06/2018 12:26   Dg Chest Port 1 View  Result Date: 01/08/2018 CLINICAL DATA:  Pneumonia EXAM: PORTABLE CHEST 1 VIEW COMPARISON:  01/01/2018 FINDINGS: Tracheostomy remains in good position. NG tube enters the stomach with the tip not visualized. Chronic lung disease with pulmonary fibrosis. No baseline studies are available to determine if there is acute infiltrate or this is all chronic lung disease. Small left effusion IMPRESSION: Pulmonary fibrosis. This could be near the patient's baseline however no baseline exams are available. Electronically Signed   By: Marlan Palauharles  Clark M.D.   On: 01/08/2018 17:23   Dg Chest Port 1 View  Result Date: 01/01/2018 CLINICAL DATA:  Fever and increased shortness of breath today EXAM: PORTABLE CHEST 1 VIEW COMPARISON:  Portable exam 1004 hours compared to 12/26/2017 FINDINGS: Tip of tracheostomy tube projects 5.5 cm above carina. Nasogastric tube extends into stomach. Normal heart size, mediastinal contours, and pulmonary vascularity. BILATERAL pulmonary infiltrates, least severe in lower RIGHT lung, most likely representing multifocal pneumonia. No gross pleural effusion or pneumothorax. IMPRESSION: Persistent BILATERAL asymmetric pulmonary infiltrates favoring multifocal pneumonia, little changed. Electronically Signed   By: Ulyses SouthwardMark  Boles M.D.   On: 01/01/2018 10:20   Dg Chest Port 1 View  Result Date: 12/26/2017 CLINICAL DATA:  Pneumonia.  Respiratory failure. EXAM: PORTABLE CHEST 1 VIEW COMPARISON:  Single-view of the chest 12/16/2017. FINDINGS: The patient's endotracheal tube has been removed with a new tracheostomy tube in place. Tip of the new tube projects at the  clavicular heads. NG tube courses into the stomach. Extensive bilateral airspace disease is worse on the left and not notably change compared to the prior exam. Heart size is normal. No pneumothorax. Pigtail drainage catheter projecting in the right upper quadrant of the abdomen noted. IMPRESSION: New tracheostomy tube projects No acute abnormality. In good position No marked change in left worse than right airspace disease most compatible with pneumonia. Electronically Signed   By: Maisie Fushomas  Dalessio M.D.   On: 12/26/2017 16:59   Dg Chest Port 1 View  Result Date: 12/16/2017 CLINICAL DATA:  Endotracheal tube placement. EXAM: PORTABLE CHEST 1 VIEW COMPARISON:  None. FINDINGS: Endotracheal tube terminates 2.5 cm above the carina. The balloon may be hyperinflated. Nasogastric tube terminates in the stomach. Left IJ central line tip may be directed posteriorly within the azygos vein. There is patchy bibasilar airspace opacification. Suspect a small right pleural effusion. IMPRESSION: 1. Endotracheal tube is slightly low lying. Pulling back approximately 1 cm would likely better position the tip above the carina. Difficult to exclude associated balloon hyperinflation. 2. Left IJ central line tip may be directed posteriorly, within the azygos vein. 3. Patchy bilateral airspace opacification may be due to edema or pneumonia. 4. Probable right pleural effusion. Electronically Signed   By: Leanna Battles M.D.   On: 12/16/2017 15:21   Dg Abd Portable 1v  Result Date: 01/01/2018 CLINICAL DATA:  Fever and increased shortness of breath today. Increased abdominal distension for the past 3 weeks. Assess NG tube position. EXAM: PORTABLE ABDOMEN - 1 VIEW COMPARISON:  Abdominal radiograph of December 21, 2017 FINDINGS: There is a relative paucity of bowel gas. A small amount of gas is noted in the ascending colon. The NG tube tip projects in the region of the pylorus or proximal duodenum. There is a pigtail drainage catheter in  the right upper quadrant which is partially visualized today. There is an inferior vena caval filter present. There are surgical clips in the gallbladder fossa with additional surgical clips overlying the right aspect of the sacrum. IMPRESSION: Relative paucity of bowel gas. No evidence of obstruction or ileus. The NG tube is in reasonable position with the tip projecting in the distal pylorus or proximal duodenum. There is a drainage catheter in the right upper quadrant, presumably a cholecystostomy tube, which is partially included in the field of view. Electronically Signed   By: David  [place] M.D.   On: 01/01/2018 10:20   Dg Abd Portable 1v  Result Date: 12/21/2017 CLINICAL DATA:  Status post orogastric tube placement. EXAM: PORTABLE ABDOMEN - 1 VIEW COMPARISON:  Abdominal radiograph of March 23rd 2019 FINDINGS: The nasogastric tubes proximal port lies just above the GE junction with the tip of the tube just inside the gastric cardia. There is a small amount of distended small bowel to the left of midline. There is an inferior vena caval filter present. There is a drainage tube present peripherally in the left upper quadrant. There surgical clips in the gallbladder fossa. There is a compression fracture of the body of L1 which is not new. IMPRESSION: Advancement of the nasogastric tube by 10-15 cm is recommended to assure that both the tip and proximal port lie below the GE junction. Electronically Signed   By: David  [place] M.D.   On: 12/21/2017 13:57   Dg Abd Portable 1v  Result Date: 12/16/2017 CLINICAL DATA:  Nasogastric tube placement. EXAM: PORTABLE ABDOMEN - 1 VIEW COMPARISON:  None. FINDINGS: Nasogastric tube terminates in the stomach. IVC filter is noted. Gas is seen in the colon. IMPRESSION: Nasogastric tube terminates in the stomach. Electronically Signed   By: Leanna Battles M.D.   On: 12/16/2017 15:22    Labs:  CBC: Recent Labs    01/01/18 1850 01/03/18 0621 01/05/18 0831  01/07/18 0543  WBC 9.0 7.4 11.3* 12.0*  HGB 8.4* 7.8* 8.3* 7.9*  HCT 28.3* 25.2* 27.6* 26.2*  PLT 203 240 245 222  COAGS: Recent Labs    12/16/17 1603  01/06/18 0829 01/07/18 0543 01/08/18 0815 01/09/18 0742  INR 1.33   < > 1.59 1.70 1.85 1.56  APTT 120*  --   --   --   --   --    < > = values in this interval not displayed.    BMP: Recent Labs    01/01/18 0939 01/02/18 0719 01/05/18 0831 01/07/18 0543  NA 142 140 138 131*  K 2.9* 3.6 3.5 4.2  CL 109 109 105 99*  CO2 23 25 24 23   GLUCOSE 187* 172* 168* 156*  BUN 42* 34* 41* 66*  CALCIUM 7.4* 7.3* 7.3* 7.2*  CREATININE 1.64* 1.49* 1.62* 2.23*  GFRNONAA 39* 44* 40* 27*  GFRAA 45* 51* 46* 31*    LIVER FUNCTION TESTS: Recent Labs    12/17/17 0739  BILITOT 0.4  AST 89*  ALT 97*  ALKPHOS 105  PROT 6.8  ALBUMIN 1.4*    TUMOR MARKERS: No results for input(s): AFPTM, CEA, CA199, CHROMGRNA in the last 8760 hours.  Assessment and Plan:  Dysphagia Malnutrition Need for long term care Scheduled for percutaneous gastric tube placement in IR Risks and benefits discussed with the patient's wife including, but not limited to the need for a barium enema during the procedure, bleeding, infection, peritonitis, or damage to adjacent structures.  All of the patient's wife questions were answered, she is agreeable to proceed. Consent signed and in chart.     Thank you for this interesting consult.  I greatly enjoyed meeting Dwan Hemmelgarn and look forward to participating in their care.  A copy of this report was sent to the requesting provider on this date.  Electronically Signed: Robet Leu, PA-C 01/09/2018, 1:31 PM   I spent a total of 40 Minutes    in face to face in clinical consultation, greater than 50% of which was counseling/coordinating care for percutaneous gastric tube placement

## 2018-01-09 NOTE — Progress Notes (Signed)
Pulmonary Critical Care Medicine [name]   PULMONARY SERVICE  PROGRESS NOTE  Date of Service: 01/09/2018  Attila Mccarthy  ZOX:[id number]  DOB: 22-Sep-1941   DOA: 12/16/2017  Referring Physician: Carron Curie, MD  HPI: Rodney Cordova is a 77 y.o. male seen for follow up of Acute on Chronic Respiratory Failure.  Currently on assist control mode patient on 20% oxygen with good volumes.  Supposed to have a PEG placed  Medications: Reviewed on Rounds  Physical Exam:  Vitals: Temperature 96.8 pulse 91 restart 18 blood pressure 121/68 saturation 98%  Ventilator Settings mode of ventilation assist control FiO2 28% tidal volume 450 PEEP 5  . General: Comfortable at this time . Eyes: Grossly normal lids, irises & conjunctiva . ENT: grossly tongue is normal . Neck: no obvious mass . Cardiovascular: S1-S2 normal no gallop . Respiratory: No rhonchi expansion is equal . Abdomen: Soft nontender . Skin: no rash seen on limited exam . Musculoskeletal: not rigid . Psychiatric:unable to assess . Neurologic: no seizure no involuntary movements         Labs on Admission:  Basic Metabolic Panel: Recent Labs  Lab 01/03/18 0621 01/04/18 1012 01/05/18 0831 01/07/18 0543  NA  --   --  138 131*  K  --   --  3.5 4.2  CL  --   --  105 99*  CO2  --   --  24 23  GLUCOSE  --   --  168* 156*  BUN  --   --  41* 66*  CREATININE  --   --  1.62* 2.23*  CALCIUM  --   --  7.3* 7.2*  MG 1.3* 1.7 1.5* 2.1    Liver Function Tests: No results for input(s): AST, ALT, ALKPHOS, BILITOT, PROT, ALBUMIN in the last 168 hours. No results for input(s): LIPASE, AMYLASE in the last 168 hours. No results for input(s): AMMONIA in the last 168 hours.  CBC: Recent Labs  Lab 01/03/18 0621 01/05/18 0831 01/07/18 0543  WBC 7.4 11.3* 12.0*  NEUTROABS  --   --  9.2*  HGB 7.8* 8.3* 7.9*  HCT 25.2* 27.6* 26.2*  MCV 96.9 96.5 93.9  PLT 240 245 222    Cardiac Enzymes: No results for  input(s): CKTOTAL, CKMB, CKMBINDEX, TROPONINI in the last 168 hours.  BNP (last 3 results) No results for input(s): BNP in the last 8760 hours.  ProBNP (last 3 results) No results for input(s): PROBNP in the last 8760 hours.  Radiological Exams on Admission: Ct Abdomen Pelvis W Contrast  Result Date: 01/06/2018 CLINICAL DATA:  Gangrenous gallbladder abscess, continued fever 101 degrees EXAM: CT ABDOMEN AND PELVIS WITH CONTRAST TECHNIQUE: Multidetector CT imaging of the abdomen and pelvis was performed using the standard protocol following bolus administration of intravenous contrast. Sagittal and coronal MPR images reconstructed from axial data set. CONTRAST:  100 mL ISOVUE-300 IOPAMIDOL (ISOVUE-300) INJECTION 61% IV. Dilute oral contrast. COMPARISON:  12/17/2017 FINDINGS: Lower chest: Bibasilar atelectasis and tiny RIGHT pleural effusion Hepatobiliary: Gallbladder surgically absent. Abscess drain again identified at the inferior RIGHT lobe liver with minimal residual tissue thickening/chronic collection along the inner border of the RIGHT lateral abdominal wall, slightly decreased. No focal hepatic lesions. Pancreas: Normal appearance Spleen: Normal appearance Adrenals/Urinary Tract: Adrenal glands, kidneys, ureters, and bladder normal appearance Stomach/Bowel: Resolution of ascending/transverse colonic wall thickening seen on previous exam. Tip of nasogastric tube is at the duodenal bulb. Sigmoid diverticulosis without evidence of diverticulitis. Fluid-filled rectum. Stomach and bowel  loops otherwise normal appearance. Vascular/Lymphatic: Atherosclerotic calcifications aorta without aneurysm. IVC filter with tip at the level of the renal veins. No adenopathy. Reproductive: Unremarkable Other: BILATERAL inguinal hernias containing fat. No free air or free fluid. No inflammatory process. Musculoskeletal: Bones demineralized. Chronic compression deformity of the superior endplate of L1. Scattered  degenerative disc disease changes lumbar spine. IMPRESSION: Sub appendix RIGHT upper quadrant abscess drain with slight decrease in thickening/fluid inferior to the drain along the inner surface of the RIGHT lateral abdominal wall. No new abscess collections identified. Sigmoid diverticulosis without diverticulitis changes. Small BILATERAL inguinal hernias containing fat. No new intra-abdominal or intrapelvic abnormalities. Electronically Signed   By: Ulyses SouthwardMark  Boles M.D.   On: 01/06/2018 12:26   Dg Chest Port 1 View  Result Date: 01/08/2018 CLINICAL DATA:  Pneumonia EXAM: PORTABLE CHEST 1 VIEW COMPARISON:  01/01/2018 FINDINGS: Tracheostomy remains in good position. NG tube enters the stomach with the tip not visualized. Chronic lung disease with pulmonary fibrosis. No baseline studies are available to determine if there is acute infiltrate or this is all chronic lung disease. Small left effusion IMPRESSION: Pulmonary fibrosis. This could be near the patient's baseline however no baseline exams are available. Electronically Signed   By: Marlan Palauharles  Clark M.D.   On: 01/08/2018 17:23    Assessment/Plan Active Problems:   Pulmonary emboli (HCC)   Acute DVT (deep venous thrombosis) (HCC)   Postoperative intra-abdominal abscess   Acute on chronic respiratory failure with hypoxia (HCC)   Acute cholecystitis   Lobar pneumonia (HCC)   Essential hypertension   1. Acute on chronic respiratory failure with hypoxia not amenable at this time we will continue with full vent support 2. Postoperative intra-abdominal abscess clinically resolving on the last CT 3. Pulmonary embolism treated we will continue to follow 4. Lobar pneumonia treated last chest x-ray shows evidence of fibrotic disease   I have personally seen and evaluated the patient, evaluated laboratory and imaging results, formulated the assessment and plan and placed orders. The Patient requires high complexity decision making for assessment and  support.  Case was discussed on Rounds with the Respiratory Therapy Staff  Yevonne PaxSaadat A Khan, MD [facility] Pulmonary Critical Care Medicine Sleep Medicine

## 2018-01-10 DIAGNOSIS — J9621 Acute and chronic respiratory failure with hypoxia: Secondary | ICD-10-CM | POA: Diagnosis not present

## 2018-01-10 DIAGNOSIS — J181 Lobar pneumonia, unspecified organism: Secondary | ICD-10-CM | POA: Diagnosis not present

## 2018-01-10 DIAGNOSIS — T8149XA Infection following a procedure, other surgical site, initial encounter: Secondary | ICD-10-CM | POA: Diagnosis not present

## 2018-01-10 DIAGNOSIS — I2699 Other pulmonary embolism without acute cor pulmonale: Secondary | ICD-10-CM | POA: Diagnosis not present

## 2018-01-10 LAB — CULTURE, BLOOD (ROUTINE X 2)
CULTURE: NO GROWTH
Culture: NO GROWTH

## 2018-01-10 LAB — CBC
HCT: 26.5 % — ABNORMAL LOW (ref 39.0–52.0)
Hemoglobin: 7.9 g/dL — ABNORMAL LOW (ref 13.0–17.0)
MCH: 27.9 pg (ref 26.0–34.0)
MCHC: 29.8 g/dL — AB (ref 30.0–36.0)
MCV: 93.6 fL (ref 78.0–100.0)
PLATELETS: 289 10*3/uL (ref 150–400)
RBC: 2.83 MIL/uL — ABNORMAL LOW (ref 4.22–5.81)
RDW: 16.9 % — AB (ref 11.5–15.5)
WBC: 10.9 10*3/uL — AB (ref 4.0–10.5)

## 2018-01-10 LAB — PROTIME-INR
INR: 1.86
Prothrombin Time: 21.3 seconds — ABNORMAL HIGH (ref 11.4–15.2)

## 2018-01-10 NOTE — Progress Notes (Signed)
[facility] [place]   PULMONARY SERVICE  PROGRESS NOTE  Date of Service: 01/10/2018  Rodney MainsGrier Schirm  ZOX:[id number]  DOB: 11/30/1940   DOA: 12/16/2017  Referring Physician: Carron CurieAli Hijazi, MD  HPI: Rodney Cordova is a 77 y.o. male seen for follow up of Acute on Chronic Respiratory Failure.  Patient remains on full support currently is on assist control mode has been on 28% oxygen.  Patient apparently received some Coumadin so the PEG placement was put on hold for now  Medications: Reviewed on Rounds  Physical Exam:  Vitals: Temperature 97.4 pulse 105 respiratory rate 33 blood pressure 119/75 saturations 96%  Ventilator Settings mode of ventilation assist control FiO2 20% tidal volume 499 PEEP 5  . General: Comfortable at this time . Eyes: Grossly normal lids, irises & conjunctiva . ENT: grossly tongue is normal . Neck: no obvious mass . Cardiovascular: S1-S2 normal no gallop or rub . Respiratory: Good aeration noted bilaterally no rhonchi . Abdomen: Soft nontender . Skin: no rash seen on limited exam . Musculoskeletal: not rigid . Psychiatric:unable to assess . Neurologic: no seizure no involuntary movements         Labs on Admission:  Basic Metabolic Panel: Recent Labs  Lab 01/04/18 1012 01/05/18 0831 01/07/18 0543  NA  --  138 131*  K  --  3.5 4.2  CL  --  105 99*  CO2  --  24 23  GLUCOSE  --  168* 156*  BUN  --  41* 66*  CREATININE  --  1.62* 2.23*  CALCIUM  --  7.3* 7.2*  MG 1.7 1.5* 2.1    Liver Function Tests: No results for input(s): AST, ALT, ALKPHOS, BILITOT, PROT, ALBUMIN in the last 168 hours. No results for input(s): LIPASE, AMYLASE in the last 168 hours. No results for input(s): AMMONIA in the last 168 hours.  CBC: Recent Labs  Lab 01/05/18 0831 01/07/18 0543 01/10/18 0502  WBC 11.3* 12.0* 10.9*  NEUTROABS  --  9.2*  --   HGB 8.3* 7.9* 7.9*  HCT 27.6* 26.2* 26.5*  MCV 96.5 93.9 93.6  PLT 245 222  289    Cardiac Enzymes: No results for input(s): CKTOTAL, CKMB, CKMBINDEX, TROPONINI in the last 168 hours.  BNP (last 3 results) No results for input(s): BNP in the last 8760 hours.  ProBNP (last 3 results) No results for input(s): PROBNP in the last 8760 hours.  Radiological Exams on Admission: Dg Chest Port 1 View  Result Date: 01/08/2018 CLINICAL DATA:  Pneumonia EXAM: PORTABLE CHEST 1 VIEW COMPARISON:  01/01/2018 FINDINGS: Tracheostomy remains in good position. NG tube enters the stomach with the tip not visualized. Chronic lung disease with pulmonary fibrosis. No baseline studies are available to determine if there is acute infiltrate or this is all chronic lung disease. Small left effusion IMPRESSION: Pulmonary fibrosis. This could be near the patient's baseline however no baseline exams are available. Electronically Signed   By: Marlan Palauharles  Clark M.D.   On: 01/08/2018 17:23    Assessment/Plan Active Problems:   Pulmonary emboli (HCC)   Acute DVT (deep venous thrombosis) (HCC)   Postoperative intra-abdominal abscess   Acute on chronic respiratory failure with hypoxia (HCC)   Acute cholecystitis   Lobar pneumonia (HCC)   Essential hypertension   1. Acute on chronic respiratory failure with hypoxia patient continues on full vent support at this time the RSB I mechanics has been poor patients not able to tolerate pressure support.  Respiratory we will continue to assess 2. Postoperative intra-abdominal abscess clinically improving fevers are improved 3. Lobar pneumonia treated with antibiotics continue daily supportive care 4. Will continue with supportive care   I have personally seen and evaluated the patient, evaluated laboratory and imaging results, formulated the assessment and plan and placed orders. The Patient requires high complexity decision making for assessment and support.  Case was discussed on Rounds with the Respiratory Therapy Staff  Yevonne Pax, MD  [facility] Pulmonary Critical Care Medicine Sleep Medicine

## 2018-01-11 DIAGNOSIS — I2699 Other pulmonary embolism without acute cor pulmonale: Secondary | ICD-10-CM | POA: Diagnosis not present

## 2018-01-11 DIAGNOSIS — J9621 Acute and chronic respiratory failure with hypoxia: Secondary | ICD-10-CM | POA: Diagnosis not present

## 2018-01-11 DIAGNOSIS — J181 Lobar pneumonia, unspecified organism: Secondary | ICD-10-CM | POA: Diagnosis not present

## 2018-01-11 DIAGNOSIS — T8149XA Infection following a procedure, other surgical site, initial encounter: Secondary | ICD-10-CM | POA: Diagnosis not present

## 2018-01-11 LAB — PROTIME-INR
INR: 2.53
PROTHROMBIN TIME: 27 s — AB (ref 11.4–15.2)

## 2018-01-11 NOTE — Progress Notes (Signed)
[facility] [place]   PULMONARY SERVICE  PROGRESS NOTE  Date of Service: 01/11/2018  Rodney MainsGrier Bastyr  ZOX:[id number]  DOB: 08/12/1941   DOA: 12/16/2017  Referring Physician: Carron CurieAli Hijazi, MD  HPI: Rodney Cordova is a 77 y.o. male seen for follow up of Acute on Chronic Respiratory Failure.  Patient has been on full support this afternoon.  In the morning was able to do pressure support for approximately 4 hours.  She was supposed to have the PEG placed however did not go today because of his INR levels  Medications: Reviewed on Rounds  Physical Exam:  Vitals: Temperature 97.8 pulse 94 respiratory 24 blood pressure 140/79 saturations 100%  Ventilator Settings mode of ventilation assist control FiO2 28% on 09/30/1948 PEEP 5  . General: Comfortable at this time . Eyes: Grossly normal lids, irises & conjunctiva . ENT: grossly tongue is normal . Neck: no obvious mass . Cardiovascular: S1-S2 normal no gallop . Respiratory: No rhonchi expansion is equal . Abdomen: Soft and nontender . Skin: no rash seen on limited exam . Musculoskeletal: not rigid . Psychiatric:unable to assess . Neurologic: no seizure no involuntary movements         Labs on Admission:  Basic Metabolic Panel: Recent Labs  Lab 01/05/18 0831 01/07/18 0543  NA 138 131*  K 3.5 4.2  CL 105 99*  CO2 24 23  GLUCOSE 168* 156*  BUN 41* 66*  CREATININE 1.62* 2.23*  CALCIUM 7.3* 7.2*  MG 1.5* 2.1    Liver Function Tests: No results for input(s): AST, ALT, ALKPHOS, BILITOT, PROT, ALBUMIN in the last 168 hours. No results for input(s): LIPASE, AMYLASE in the last 168 hours. No results for input(s): AMMONIA in the last 168 hours.  CBC: Recent Labs  Lab 01/05/18 0831 01/07/18 0543 01/10/18 0502  WBC 11.3* 12.0* 10.9*  NEUTROABS  --  9.2*  --   HGB 8.3* 7.9* 7.9*  HCT 27.6* 26.2* 26.5*  MCV 96.5 93.9 93.6  PLT 245 222 289    Cardiac Enzymes: No results for  input(s): CKTOTAL, CKMB, CKMBINDEX, TROPONINI in the last 168 hours.  BNP (last 3 results) No results for input(s): BNP in the last 8760 hours.  ProBNP (last 3 results) No results for input(s): PROBNP in the last 8760 hours.  Radiological Exams on Admission: Dg Chest Port 1 View  Result Date: 01/08/2018 CLINICAL DATA:  Pneumonia EXAM: PORTABLE CHEST 1 VIEW COMPARISON:  01/01/2018 FINDINGS: Tracheostomy remains in good position. NG tube enters the stomach with the tip not visualized. Chronic lung disease with pulmonary fibrosis. No baseline studies are available to determine if there is acute infiltrate or this is all chronic lung disease. Small left effusion IMPRESSION: Pulmonary fibrosis. This could be near the patient's baseline however no baseline exams are available. Electronically Signed   By: Marlan Palauharles  Clark M.D.   On: 01/08/2018 17:23    Assessment/Plan Active Problems:   Pulmonary emboli (HCC)   Acute DVT (deep venous thrombosis) (HCC)   Postoperative intra-abdominal abscess   Acute on chronic respiratory failure with hypoxia (HCC)   Acute cholecystitis   Lobar pneumonia (HCC)   Essential hypertension   1. Acute on chronic respiratory failure with hypoxia did fine when the first day of weaning attempt and pressure support we will try to advance 2. Postoperative intra-abdominal abscess continue with present management 3. Lobar pneumonia treated with antibiotics clinically improved 4. Pulmonary emboli treated has been on anticoagulation will continue with supportive care  hold anticoagulation for the surgical procedure that is planned   I have personally seen and evaluated the patient, evaluated laboratory and imaging results, formulated the assessment and plan and placed orders. The Patient requires high complexity decision making for assessment and support.  Case was discussed on Rounds with the Respiratory Therapy Staff  Yevonne Pax, MD [facility] Pulmonary Critical Care  Medicine Sleep Medicine

## 2018-01-12 DIAGNOSIS — T8149XA Infection following a procedure, other surgical site, initial encounter: Secondary | ICD-10-CM | POA: Diagnosis not present

## 2018-01-12 DIAGNOSIS — I2699 Other pulmonary embolism without acute cor pulmonale: Secondary | ICD-10-CM | POA: Diagnosis not present

## 2018-01-12 DIAGNOSIS — J9621 Acute and chronic respiratory failure with hypoxia: Secondary | ICD-10-CM | POA: Diagnosis not present

## 2018-01-12 DIAGNOSIS — J181 Lobar pneumonia, unspecified organism: Secondary | ICD-10-CM | POA: Diagnosis not present

## 2018-01-12 LAB — PROTIME-INR
INR: 1.9
Prothrombin Time: 21.7 seconds — ABNORMAL HIGH (ref 11.4–15.2)

## 2018-01-12 NOTE — Progress Notes (Signed)
[facility] [place]   PULMONARY SERVICE  PROGRESS NOTE  Date of Service: 01/12/2018  Rodney Cordova  BJY:[id number]:[id number]  DOB: 09/02/1941   DOA: 12/16/2017  Referring Physician: Carron CurieAli Hijazi, MD  HPI: Rodney Cordova is a 77 y.o. male seen for follow up of Acute on Chronic Respiratory Failure.  Remains on the ventilator and full support.  Patient is on assist control mode  Medications: Reviewed on Rounds  Physical Exam:  Vitals: Temperature 97.6 pulse 69 respiratory rate 19 blood pressure 155/83 saturations 98%  Ventilator Settings mode of ventilation assist control tidal volume 450 PEEP 5  . General: Comfortable at this time . Eyes: Grossly normal lids, irises & conjunctiva . ENT: grossly tongue is normal . Neck: no obvious mass . Cardiovascular: S1-S2 normal no gallop or rub . Respiratory: Good aeration bilaterally with scattered rhonchi . Abdomen: Obese soft nondistended . Skin: no rash seen on limited exam . Musculoskeletal: not rigid . Psychiatric:unable to assess . Neurologic: no seizure no involuntary movements         Labs on Admission:  Basic Metabolic Panel: Recent Labs  Lab 01/07/18 0543  NA 131*  K 4.2  CL 99*  CO2 23  GLUCOSE 156*  BUN 66*  CREATININE 2.23*  CALCIUM 7.2*  MG 2.1    Liver Function Tests: No results for input(s): AST, ALT, ALKPHOS, BILITOT, PROT, ALBUMIN in the last 168 hours. No results for input(s): LIPASE, AMYLASE in the last 168 hours. No results for input(s): AMMONIA in the last 168 hours.  CBC: Recent Labs  Lab 01/07/18 0543 01/10/18 0502  WBC 12.0* 10.9*  NEUTROABS 9.2*  --   HGB 7.9* 7.9*  HCT 26.2* 26.5*  MCV 93.9 93.6  PLT 222 289    Cardiac Enzymes: No results for input(s): CKTOTAL, CKMB, CKMBINDEX, TROPONINI in the last 168 hours.  BNP (last 3 results) No results for input(s): BNP in the last 8760 hours.  ProBNP (last 3 results) No results for input(s): PROBNP in the  last 8760 hours.  Radiological Exams on Admission: Dg Chest Port 1 View  Result Date: 01/08/2018 CLINICAL DATA:  Pneumonia EXAM: PORTABLE CHEST 1 VIEW COMPARISON:  01/01/2018 FINDINGS: Tracheostomy remains in good position. NG tube enters the stomach with the tip not visualized. Chronic lung disease with pulmonary fibrosis. No baseline studies are available to determine if there is acute infiltrate or this is all chronic lung disease. Small left effusion IMPRESSION: Pulmonary fibrosis. This could be near the patient's baseline however no baseline exams are available. Electronically Signed   By: Marlan Palauharles  Clark M.D.   On: 01/08/2018 17:23    Assessment/Plan Active Problems:   Pulmonary emboli (HCC)   Acute DVT (deep venous thrombosis) (HCC)   Postoperative intra-abdominal abscess   Acute on chronic respiratory failure with hypoxia (HCC)   Acute cholecystitis   Lobar pneumonia (HCC)   Essential hypertension   1. Acute on chronic respiratory failure with hypoxia we will have respiratory continue to assess wean readiness we will try pressure support if able to tolerate we will continue with pulmonary toilet and secretion management 2. Pulmonary emboli at baseline 3. Postoperative intra-abdominal abscess treated with IV antibiotics clinically improving 4. Lobar pneumonia treated with antibiotics also clinically improving we will continue to follow along   I have personally seen and evaluated the patient, evaluated laboratory and imaging results, formulated the assessment and plan and placed orders. The Patient requires high complexity decision making for assessment and support.  Case was discussed on Rounds with the Respiratory Therapy Staff  Allyne Gee, MD [facility] Pulmonary Critical Care Medicine Sleep Medicine

## 2018-01-13 DIAGNOSIS — I2699 Other pulmonary embolism without acute cor pulmonale: Secondary | ICD-10-CM | POA: Diagnosis not present

## 2018-01-13 DIAGNOSIS — J9621 Acute and chronic respiratory failure with hypoxia: Secondary | ICD-10-CM | POA: Diagnosis not present

## 2018-01-13 DIAGNOSIS — J181 Lobar pneumonia, unspecified organism: Secondary | ICD-10-CM | POA: Diagnosis not present

## 2018-01-13 DIAGNOSIS — T8149XA Infection following a procedure, other surgical site, initial encounter: Secondary | ICD-10-CM | POA: Diagnosis not present

## 2018-01-13 LAB — BLOOD GAS, ARTERIAL
ACID-BASE EXCESS: 2.3 mmol/L — AB (ref 0.0–2.0)
BICARBONATE: 25.6 mmol/L (ref 20.0–28.0)
FIO2: 28
LHR: 16 {breaths}/min
MECHVT: 450 mL
O2 Saturation: 97.4 %
PATIENT TEMPERATURE: 98.6
PCO2 ART: 34.8 mmHg (ref 32.0–48.0)
PEEP/CPAP: 5 cmH2O
PO2 ART: 85.8 mmHg (ref 83.0–108.0)
pH, Arterial: 7.479 — ABNORMAL HIGH (ref 7.350–7.450)

## 2018-01-13 LAB — BPAM FFP
Blood Product Expiration Date: 201904242359
Blood Product Expiration Date: 201904242359
ISSUE DATE / TIME: 201904190355
ISSUE DATE / TIME: 201904190355
Unit Type and Rh: 5100
Unit Type and Rh: 5100

## 2018-01-13 LAB — PREPARE FRESH FROZEN PLASMA
UNIT DIVISION: 0
Unit division: 0

## 2018-01-13 LAB — PROTIME-INR
INR: 1.47
Prothrombin Time: 17.7 seconds — ABNORMAL HIGH (ref 11.4–15.2)

## 2018-01-13 NOTE — Progress Notes (Signed)
[facility] [place]   PULMONARY SERVICE  PROGRESS NOTE  Date of Service: 01/13/2018  Rodney Cordova  UJW:[id number]:[id number]  DOB: 02/07/1941   DOA: 12/16/2017  Referring Physician: Carron CurieAli Hijazi, MD  HPI: Rodney Cordova is a 77 y.o. male seen for follow up of Acute on Chronic Respiratory Failure.  Right now is on pressure support mode patient's been requiring 20% oxygen currently is on a pressure support 12/5 was able to do about 5 hours previously  Medications: Reviewed on Rounds  Physical Exam:  Vitals: Temperature 98.8 pulse 94 history 35 blood pressure 149/85 saturations 97%  Ventilator Settings mode of ventilation pressure support FiO2 28% tidal volume 450 pressure support 12 PEEP 5  . General: Comfortable at this time . Eyes: Grossly normal lids, irises & conjunctiva . ENT: grossly tongue is normal . Neck: no obvious mass . Cardiovascular: S1-S2 normal no gallop . Respiratory: No rhonchi noted . Abdomen: Soft nontender . Skin: no rash seen on limited exam . Musculoskeletal: not rigid . Psychiatric:unable to assess . Neurologic: no seizure no involuntary movements         Labs on Admission:  Basic Metabolic Panel: Recent Labs  Lab 01/07/18 0543  NA 131*  K 4.2  CL 99*  CO2 23  GLUCOSE 156*  BUN 66*  CREATININE 2.23*  CALCIUM 7.2*  MG 2.1    Liver Function Tests: No results for input(s): AST, ALT, ALKPHOS, BILITOT, PROT, ALBUMIN in the last 168 hours. No results for input(s): LIPASE, AMYLASE in the last 168 hours. No results for input(s): AMMONIA in the last 168 hours.  CBC: Recent Labs  Lab 01/07/18 0543 01/10/18 0502  WBC 12.0* 10.9*  NEUTROABS 9.2*  --   HGB 7.9* 7.9*  HCT 26.2* 26.5*  MCV 93.9 93.6  PLT 222 289    Cardiac Enzymes: No results for input(s): CKTOTAL, CKMB, CKMBINDEX, TROPONINI in the last 168 hours.  BNP (last 3 results) No results for input(s): BNP in the last 8760 hours.  ProBNP (last 3  results) No results for input(s): PROBNP in the last 8760 hours.  Radiological Exams on Admission: No results found.  Assessment/Plan Active Problems:   Pulmonary emboli (HCC)   Acute DVT (deep venous thrombosis) (HCC)   Postoperative intra-abdominal abscess   Acute on chronic respiratory failure with hypoxia (HCC)   Acute cholecystitis   Lobar pneumonia (HCC)   Essential hypertension   1. Acute on chronic respiratory failure with hypoxia we will try weaning again on pressure support continue with supportive care titrate oxygen as tolerated 2. Lobar pneumonia treated with antibiotics we will continue to follow 3. Pulmonary embolism we will continue to follow clinically 4. Postoperative intra-abdominal abscess treated   I have personally seen and evaluated the patient, evaluated laboratory and imaging results, formulated the assessment and plan and placed orders. The Patient requires high complexity decision making for assessment and support.  Case was discussed on Rounds with the Respiratory Therapy Staff  Yevonne PaxSaadat A Khan, MD [facility] Pulmonary Critical Care Medicine Sleep Medicine

## 2018-01-14 ENCOUNTER — Other Ambulatory Visit (HOSPITAL_COMMUNITY): Payer: Medicare Other

## 2018-01-14 DIAGNOSIS — J181 Lobar pneumonia, unspecified organism: Secondary | ICD-10-CM | POA: Diagnosis not present

## 2018-01-14 DIAGNOSIS — J9621 Acute and chronic respiratory failure with hypoxia: Secondary | ICD-10-CM | POA: Diagnosis not present

## 2018-01-14 DIAGNOSIS — I2699 Other pulmonary embolism without acute cor pulmonale: Secondary | ICD-10-CM | POA: Diagnosis not present

## 2018-01-14 DIAGNOSIS — T8149XA Infection following a procedure, other surgical site, initial encounter: Secondary | ICD-10-CM | POA: Diagnosis not present

## 2018-01-14 LAB — PROTIME-INR
INR: 1.49
Prothrombin Time: 17.9 seconds — ABNORMAL HIGH (ref 11.4–15.2)

## 2018-01-14 NOTE — Progress Notes (Signed)
[facility] [place]   PULMONARY SERVICE  PROGRESS NOTE  Date of Service: 01/14/2018  Rodney MainsGrier Daggs  ZOX:[id number]  DOB: 06/28/1941   DOA: 12/16/2017  Referring Physician: Carron CurieAli Hijazi, MD  HPI: Rodney Cordova is a 77 y.o. male seen for follow up of Acute on Chronic Respiratory Failure.  Remains on full vent support.  Patient has had issues with increased work of breathing noted.  Patient is currently on 20% oxygen probably getting septic  Medications: Reviewed on Rounds  Physical Exam:  Vitals: Temperature 99.9 pulse 110 Mr. rate 30 blood pressure 144/80 saturations 96%  Ventilator Settings on assist control mode tidal volume 450 PEEP 5  . General: Comfortable at this time . Eyes: Grossly normal lids, irises & conjunctiva . ENT: grossly tongue is normal . Neck: no obvious mass . Cardiovascular: S1-S2 normal no gallop or rub . Respiratory: No rhonchi expansion is equal . Abdomen: Soft nontender . Skin: no rash seen on limited exam . Musculoskeletal: not rigid . Psychiatric:unable to assess . Neurologic: no seizure no involuntary movements         Labs on Admission:  Basic Metabolic Panel: No results for input(s): NA, K, CL, CO2, GLUCOSE, BUN, CREATININE, CALCIUM, MG, PHOS in the last 168 hours.  Liver Function Tests: No results for input(s): AST, ALT, ALKPHOS, BILITOT, PROT, ALBUMIN in the last 168 hours. No results for input(s): LIPASE, AMYLASE in the last 168 hours. No results for input(s): AMMONIA in the last 168 hours.  CBC: Recent Labs  Lab 01/10/18 0502  WBC 10.9*  HGB 7.9*  HCT 26.5*  MCV 93.6  PLT 289    Cardiac Enzymes: No results for input(s): CKTOTAL, CKMB, CKMBINDEX, TROPONINI in the last 168 hours.  BNP (last 3 results) No results for input(s): BNP in the last 8760 hours.  ProBNP (last 3 results) No results for input(s): PROBNP in the last 8760 hours.  Radiological Exams on Admission: Dg Chest  Port 1 View  Result Date: 01/14/2018 CLINICAL DATA:  New onset fever. EXAM: PORTABLE CHEST 1 VIEW COMPARISON:  01/08/2018 FINDINGS: Tracheostomy in place, tip unchanged. The heart is normal in size. There are persistent airspace filling opacity throughout the lungs, unchanged over the recent exams. IMPRESSION: Stable appearance of bilateral airspace filling opacities. No new consolidations. Electronically Signed   By: Norva PavlovElizabeth  Brown M.D.   On: 01/14/2018 10:07    Assessment/Plan Active Problems:   Pulmonary emboli (HCC)   Acute DVT (deep venous thrombosis) (HCC)   Postoperative intra-abdominal abscess   Acute on chronic respiratory failure with hypoxia (HCC)   Acute cholecystitis   Lobar pneumonia (HCC)   Essential hypertension   1. Acute on chronic respiratory failure with hypoxia and unable to wean at this time we will continue with full vent support and will continue to monitor the our SBI mechanics. 2. Lobar pneumonia treated with antibiotics the last chest film showed stable disease with really no major improvement 3. Postoperative intra-abdominal abscess denies any fever.  We will continue with supportive care antibiotics being reevaluated 4. Pulmonary embolism at baseline we will continue to follow   I have personally seen and evaluated the patient, evaluated laboratory and imaging results, formulated the assessment and plan and placed orders. The Patient requires high complexity decision making for assessment and support.  Case was discussed on Rounds with the Respiratory Therapy Staff  Yevonne PaxSaadat A Deyana Wnuk, MD [facility] Pulmonary Critical Care Medicine Sleep Medicine

## 2018-01-15 ENCOUNTER — Other Ambulatory Visit (HOSPITAL_COMMUNITY): Payer: Medicare Other

## 2018-01-15 DIAGNOSIS — J181 Lobar pneumonia, unspecified organism: Secondary | ICD-10-CM | POA: Diagnosis not present

## 2018-01-15 DIAGNOSIS — I2699 Other pulmonary embolism without acute cor pulmonale: Secondary | ICD-10-CM | POA: Diagnosis not present

## 2018-01-15 DIAGNOSIS — J9621 Acute and chronic respiratory failure with hypoxia: Secondary | ICD-10-CM | POA: Diagnosis not present

## 2018-01-15 LAB — COMPREHENSIVE METABOLIC PANEL
ALT: 40 U/L (ref 17–63)
AST: 32 U/L (ref 15–41)
Albumin: 1.9 g/dL — ABNORMAL LOW (ref 3.5–5.0)
Alkaline Phosphatase: 116 U/L (ref 38–126)
Anion gap: 11 (ref 5–15)
BUN: 36 mg/dL — ABNORMAL HIGH (ref 6–20)
CO2: 22 mmol/L (ref 22–32)
Calcium: 7.7 mg/dL — ABNORMAL LOW (ref 8.9–10.3)
Chloride: 104 mmol/L (ref 101–111)
Creatinine, Ser: 2.11 mg/dL — ABNORMAL HIGH (ref 0.61–1.24)
GFR calc Af Amer: 33 mL/min — ABNORMAL LOW (ref >60)
GFR calc non Af Amer: 29 mL/min — ABNORMAL LOW (ref >60)
Glucose, Bld: 95 mg/dL (ref 65–99)
Potassium: 3.3 mmol/L — ABNORMAL LOW (ref 3.5–5.1)
Sodium: 137 mmol/L (ref 135–145)
Total Bilirubin: 0.7 mg/dL (ref 0.3–1.2)
Total Protein: 7.5 g/dL (ref 6.5–8.1)

## 2018-01-15 LAB — URINALYSIS, ROUTINE W REFLEX MICROSCOPIC
Bilirubin Urine: NEGATIVE
Glucose, UA: NEGATIVE mg/dL
Ketones, ur: NEGATIVE mg/dL
NITRITE: NEGATIVE
Protein, ur: 30 mg/dL — AB
SPECIFIC GRAVITY, URINE: 1.013 (ref 1.005–1.030)
pH: 5 (ref 5.0–8.0)

## 2018-01-15 LAB — BLOOD CULTURE ID PANEL (REFLEXED)
Acinetobacter baumannii: NOT DETECTED
CANDIDA GLABRATA: NOT DETECTED
Candida albicans: DETECTED — AB
Candida krusei: NOT DETECTED
Candida parapsilosis: NOT DETECTED
Candida tropicalis: NOT DETECTED
ENTEROBACTER CLOACAE COMPLEX: NOT DETECTED
ENTEROBACTERIACEAE SPECIES: NOT DETECTED
ENTEROCOCCUS SPECIES: NOT DETECTED
ESCHERICHIA COLI: NOT DETECTED
Haemophilus influenzae: NOT DETECTED
Klebsiella oxytoca: NOT DETECTED
Klebsiella pneumoniae: NOT DETECTED
LISTERIA MONOCYTOGENES: NOT DETECTED
Neisseria meningitidis: NOT DETECTED
PSEUDOMONAS AERUGINOSA: NOT DETECTED
Proteus species: NOT DETECTED
STREPTOCOCCUS PNEUMONIAE: NOT DETECTED
STREPTOCOCCUS PYOGENES: NOT DETECTED
Serratia marcescens: NOT DETECTED
Staphylococcus aureus (BCID): NOT DETECTED
Staphylococcus species: NOT DETECTED
Streptococcus agalactiae: NOT DETECTED
Streptococcus species: NOT DETECTED

## 2018-01-15 LAB — CBC
HCT: 28 % — ABNORMAL LOW (ref 39.0–52.0)
Hemoglobin: 8.3 g/dL — ABNORMAL LOW (ref 13.0–17.0)
MCH: 28.5 pg (ref 26.0–34.0)
MCHC: 29.6 g/dL — ABNORMAL LOW (ref 30.0–36.0)
MCV: 96.2 fL (ref 78.0–100.0)
Platelets: 271 10*3/uL (ref 150–400)
RBC: 2.91 MIL/uL — ABNORMAL LOW (ref 4.22–5.81)
RDW: 19.1 % — ABNORMAL HIGH (ref 11.5–15.5)
WBC: 11.1 10*3/uL — ABNORMAL HIGH (ref 4.0–10.5)

## 2018-01-15 LAB — LACTIC ACID, PLASMA: LACTIC ACID, VENOUS: 1.1 mmol/L (ref 0.5–1.9)

## 2018-01-15 LAB — URINE CULTURE: Culture: 40000 — AB

## 2018-01-15 LAB — PROTIME-INR
INR: 1.49
Prothrombin Time: 17.9 seconds — ABNORMAL HIGH (ref 11.4–15.2)

## 2018-01-15 NOTE — Progress Notes (Signed)
Per RN pt temp is 102.3 and Dr wants to hold off on g tube today.

## 2018-01-15 NOTE — Progress Notes (Signed)
Pulmonary Critical Care Medicine [facility] [place]   PULMONARY SERVICE  PROGRESS NOTE  Date of Service: 01/15/2018  Rodney Cordova  ZOX:[id number]  DOB: 09/10/1941   DOA: 12/16/2017  Referring Physician: Carron Curie, MD  HPI: Rodney Cordova is a 77 y.o. male seen for follow up of Acute on Chronic Respiratory Failure.  Currently is on full vent support in assist-control mode abdominal is somewhat distended.  Patient has not been tolerating any attempts at weaning  Medications: Reviewed on Rounds  Physical Exam:  Vitals:  Temperature 97.7 degrees pulse was up to 120 respiratory rate 15 blood pressure 189/94 saturations 97%  Ventilator Settings  Mode of ventilation assist-control FiO2 30% tidal volume 450 peep 5  . General: Comfortable at this time . Eyes: Grossly normal lids, irises & conjunctiva . ENT: grossly tongue is normal . Neck: no obvious mass . Cardiovascular:  S1-S2 normal no gallop or rub . Respiratory:  No rhonchi expansion is equal . Abdomen:  Soft nondistended . Skin: no rash seen on limited exam . Musculoskeletal: not rigid . Psychiatric:unable to assess . Neurologic: no seizure no involuntary movements         Labs on Admission:  Basic Metabolic Panel: Recent Labs  Lab 01/15/18 0624  NA 137  K 3.3*  CL 104  CO2 22  GLUCOSE 95  BUN 36*  CREATININE 2.11*  CALCIUM 7.7*    Liver Function Tests: Recent Labs  Lab 01/15/18 0624  AST 32  ALT 40  ALKPHOS 116  BILITOT 0.7  PROT 7.5  ALBUMIN 1.9*   No results for input(s): LIPASE, AMYLASE in the last 168 hours. No results for input(s): AMMONIA in the last 168 hours.  CBC: Recent Labs  Lab 01/10/18 0502 01/15/18 0624  WBC 10.9* 11.1*  HGB 7.9* 8.3*  HCT 26.5* 28.0*  MCV 93.6 96.2  PLT 289 271    Cardiac Enzymes: No results for input(s): CKTOTAL, CKMB, CKMBINDEX, TROPONINI in the last 168 hours.  BNP (last 3 results) No results for input(s): BNP in the last 8760  hours.  ProBNP (last 3 results) No results for input(s): PROBNP in the last 8760 hours.  Radiological Exams on Admission: Dg Chest Port 1 View  Result Date: 01/14/2018 CLINICAL DATA:  New onset fever. EXAM: PORTABLE CHEST 1 VIEW COMPARISON:  01/08/2018 FINDINGS: Tracheostomy in place, tip unchanged. The heart is normal in size. There are persistent airspace filling opacity throughout the lungs, unchanged over the recent exams. IMPRESSION: Stable appearance of bilateral airspace filling opacities. No new consolidations. Electronically Signed   By: Norva Pavlov M.D.   On: 01/14/2018 10:07   Dg Abd Portable 1v  Result Date: 01/15/2018 CLINICAL DATA:  Follow-up ileus EXAM: PORTABLE ABDOMEN - 1 VIEW COMPARISON:  Abdominal radiograph of January 01, 2018 and January 06, 2018 FINDINGS: There is esophagogastroscopy tube whose tip lies in the region of the pylorus and proximal port lies in the distal third of the stomach. There is gaseous distention of both small and large bowel in the upper and mid abdomen. No free extraluminal gas is observed. There is a pigtail drainage catheter in the right upper quadrant. There are surgical clips in the right upper quadrant and there is an inferior vena caval filter to the right of L2 and L3. There is partial compression of L2 and likely of L3. IMPRESSION: Reasonable positioning of the esophagogastric tube. Considerable small and large bowel distention with gas. No evidence of perforation observed. There is a drainage catheter  in the right upper quadrant. Electronically Signed   By: David  SwazilandJordan M.D.   On: 01/15/2018 07:51    Assessment/Plan Active Problems:   Pulmonary emboli (HCC)   Acute DVT (deep venous thrombosis) (HCC)   Postoperative intra-abdominal abscess   Acute on chronic respiratory failure with hypoxia (HCC)   Acute cholecystitis   Lobar pneumonia (HCC)   Essential hypertension   1.  acute on chronic Respiratory failure with hypoxia failing weaning  attempts several times now will continue on full vent support until patient's mechanics are better.   2. Postoperative intra-abdominal abscess patient continues with supportive care  3. Pulmonary emboli stable at this time  4. Lobar pneumonia treated with antibiotics   I have personally seen and evaluated the patient, evaluated laboratory and imaging results, formulated the assessment and plan and placed orders. The Patient requires high complexity decision making for assessment and support.  Case was discussed on Rounds with the Respiratory Therapy Staff  Yevonne PaxSaadat A Janaysha Depaulo, MD [facility] Pulmonary Critical Care Medicine Sleep Medicine

## 2018-01-16 ENCOUNTER — Other Ambulatory Visit (HOSPITAL_COMMUNITY): Payer: Medicare Other

## 2018-01-16 DIAGNOSIS — J181 Lobar pneumonia, unspecified organism: Secondary | ICD-10-CM | POA: Diagnosis not present

## 2018-01-16 DIAGNOSIS — I2699 Other pulmonary embolism without acute cor pulmonale: Secondary | ICD-10-CM | POA: Diagnosis not present

## 2018-01-16 DIAGNOSIS — T8149XA Infection following a procedure, other surgical site, initial encounter: Secondary | ICD-10-CM | POA: Diagnosis not present

## 2018-01-16 DIAGNOSIS — J9621 Acute and chronic respiratory failure with hypoxia: Secondary | ICD-10-CM | POA: Diagnosis not present

## 2018-01-16 LAB — CBC
HCT: 25.3 % — ABNORMAL LOW (ref 39.0–52.0)
Hemoglobin: 7.4 g/dL — ABNORMAL LOW (ref 13.0–17.0)
MCH: 28.1 pg (ref 26.0–34.0)
MCHC: 29.2 g/dL — AB (ref 30.0–36.0)
MCV: 96.2 fL (ref 78.0–100.0)
PLATELETS: 174 10*3/uL (ref 150–400)
RBC: 2.63 MIL/uL — ABNORMAL LOW (ref 4.22–5.81)
RDW: 19 % — AB (ref 11.5–15.5)
WBC: 8.4 10*3/uL (ref 4.0–10.5)

## 2018-01-16 LAB — BASIC METABOLIC PANEL
Anion gap: 12 (ref 5–15)
BUN: 41 mg/dL — AB (ref 6–20)
CALCIUM: 7.6 mg/dL — AB (ref 8.9–10.3)
CO2: 20 mmol/L — ABNORMAL LOW (ref 22–32)
CREATININE: 2.19 mg/dL — AB (ref 0.61–1.24)
Chloride: 108 mmol/L (ref 101–111)
GFR, EST AFRICAN AMERICAN: 32 mL/min — AB (ref >60)
GFR, EST NON AFRICAN AMERICAN: 28 mL/min — AB (ref >60)
Glucose, Bld: 110 mg/dL — ABNORMAL HIGH (ref 65–99)
Potassium: 3 mmol/L — ABNORMAL LOW (ref 3.5–5.1)
SODIUM: 140 mmol/L (ref 135–145)

## 2018-01-16 LAB — PROTIME-INR
INR: 1.24
PROTHROMBIN TIME: 15.5 s — AB (ref 11.4–15.2)

## 2018-01-16 LAB — URINE CULTURE

## 2018-01-16 MED ORDER — ACETAMINOPHEN 325 MG PO TABS
650.00 | ORAL_TABLET | ORAL | Status: DC
Start: ? — End: 2018-01-16

## 2018-01-16 MED ORDER — FENTANYL CITRATE (PF) 2500 MCG/50ML IJ SOLN
INTRAMUSCULAR | Status: DC
Start: ? — End: 2018-01-16

## 2018-01-16 MED ORDER — MECLIZINE HCL 12.5 MG PO TABS
12.50 | ORAL_TABLET | ORAL | Status: DC
Start: ? — End: 2018-01-16

## 2018-01-16 MED ORDER — METOPROLOL TARTRATE 5 MG/5ML IV SOLN
5.00 | INTRAVENOUS | Status: DC
Start: ? — End: 2018-01-16

## 2018-01-16 MED ORDER — DIPHENHYDRAMINE HCL 50 MG/ML IJ SOLN
25.00 | INTRAMUSCULAR | Status: DC
Start: ? — End: 2018-01-16

## 2018-01-16 MED ORDER — FAMOTIDINE 20 MG/2ML IV SOLN
20.00 | INTRAVENOUS | Status: DC
Start: 2017-12-17 — End: 2018-01-16

## 2018-01-16 MED ORDER — ALBUTEROL SULFATE (2.5 MG/3ML) 0.083% IN NEBU
2.50 | INHALATION_SOLUTION | RESPIRATORY_TRACT | Status: DC
Start: ? — End: 2018-01-16

## 2018-01-16 MED ORDER — BISMUTH SUBSALICYLATE 262 MG/15ML PO SUSP
30.00 | ORAL | Status: DC
Start: ? — End: 2018-01-16

## 2018-01-16 MED ORDER — GENERIC EXTERNAL MEDICATION
Status: DC
Start: ? — End: 2018-01-16

## 2018-01-16 MED ORDER — LEVOTHYROXINE SODIUM 100 MCG PO TABS
ORAL_TABLET | ORAL | Status: DC
Start: 2017-12-16 — End: 2018-01-16

## 2018-01-16 MED ORDER — IPRATROPIUM-ALBUTEROL 0.5-2.5 (3) MG/3ML IN SOLN
3.00 | RESPIRATORY_TRACT | Status: DC
Start: ? — End: 2018-01-16

## 2018-01-16 MED ORDER — INSULIN LISPRO 100 UNIT/ML ~~LOC~~ SOLN
SUBCUTANEOUS | Status: DC
Start: 2017-12-16 — End: 2018-01-16

## 2018-01-16 MED ORDER — TOBRAMYCIN-DEXAMETHASONE 0.3-0.1 % OP SUSP
OPHTHALMIC | Status: DC
Start: 2017-12-16 — End: 2018-01-16

## 2018-01-16 MED ORDER — ASPIRIN 81 MG PO CHEW
81.00 | CHEWABLE_TABLET | ORAL | Status: DC
Start: 2017-12-17 — End: 2018-01-16

## 2018-01-16 MED ORDER — DEXTROSE 10 % IV SOLN
INTRAVENOUS | Status: DC
Start: ? — End: 2018-01-16

## 2018-01-16 MED ORDER — METOPROLOL TARTRATE 25 MG PO TABS
25.00 | ORAL_TABLET | ORAL | Status: DC
Start: 2017-12-16 — End: 2018-01-16

## 2018-01-16 MED ORDER — TAMSULOSIN HCL 0.4 MG PO CAPS
.40 | ORAL_CAPSULE | ORAL | Status: DC
Start: 2017-12-16 — End: 2018-01-16

## 2018-01-16 MED ORDER — DEXTROSE-NACL 5-0.9 % IV SOLN
INTRAVENOUS | Status: DC
Start: ? — End: 2018-01-16

## 2018-01-16 MED ORDER — FENTANYL CITRATE-NACL 2.5-0.9 MG/250ML-% IV SOLN
INTRAVENOUS | Status: DC
Start: ? — End: 2018-01-16

## 2018-01-16 MED ORDER — INSULIN GLARGINE 100 UNIT/ML ~~LOC~~ SOLN
SUBCUTANEOUS | Status: DC
Start: 2017-12-16 — End: 2018-01-16

## 2018-01-16 MED ORDER — INSULIN LISPRO 100 UNIT/ML ~~LOC~~ SOLN
SUBCUTANEOUS | Status: DC
Start: ? — End: 2018-01-16

## 2018-01-16 MED ORDER — OXYCODONE HCL 10 MG PO TABS
10.00 | ORAL_TABLET | ORAL | Status: DC
Start: 2017-12-16 — End: 2018-01-16

## 2018-01-16 MED ORDER — SODIUM CHLORIDE 0.9 % IV SOLN
INTRAVENOUS | Status: DC
Start: ? — End: 2018-01-16

## 2018-01-16 MED ORDER — ALPRAZOLAM 0.5 MG PO TABS
0.50 | ORAL_TABLET | ORAL | Status: DC
Start: 2017-12-16 — End: 2018-01-16

## 2018-01-16 MED ORDER — FINASTERIDE 5 MG PO TABS
5.00 | ORAL_TABLET | ORAL | Status: DC
Start: 2017-12-17 — End: 2018-01-16

## 2018-01-16 MED ORDER — TRAMADOL HCL 50 MG PO TABS
50.00 | ORAL_TABLET | ORAL | Status: DC
Start: ? — End: 2018-01-16

## 2018-01-16 NOTE — Progress Notes (Signed)
Pulmonary Critical Care Medicine [facility] [place]   PULMONARY SERVICE  PROGRESS NOTE  Date of Service: 01/16/2018  Rodney Cordova  UEA:[id number]  DOB: [birth year]   DOA: 12/16/2017  Referring Physician: Carron Curie, MD  HPI: Rodney Cordova is a 77 y.o. male seen for follow up of Acute on Chronic Respiratory Failure.  Patient remains on full support has not been tolerating any weaning attempts.  Currently is on assist control mode and is requiring 35% oxygen.  Cultures have grown yeast and patient was started on fluconazole for therapy remains critically ill  Medications: Reviewed on Rounds  Physical Exam:  Vitals: Temperature 98.1 pulse 103 respiratory rate 20 blood pressure 146/78 saturations 98%  Ventilator Settings mode of ventilation assist control FiO2 35% rate of 16 tidal volume 450 PEEP 5  . General: Comfortable at this time . Eyes: Grossly normal lids, irises & conjunctiva . ENT: grossly tongue is normal . Neck: no obvious mass . Cardiovascular: S1-S2 normal no gallop or rub is noted . Respiratory: Coarse breath sounds noted bilaterally . Abdomen: Distended and some . Skin: no rash seen on limited exam . Musculoskeletal: not rigid . Psychiatric:unable to assess . Neurologic: no seizure no involuntary movements         Labs on Admission:  Basic Metabolic Panel: Recent Labs  Lab 01/15/18 0624 01/16/18 0621  NA 137 140  K 3.3* 3.0*  CL 104 108  CO2 22 20*  GLUCOSE 95 110*  BUN 36* 41*  CREATININE 2.11* 2.19*  CALCIUM 7.7* 7.6*    Liver Function Tests: Recent Labs  Lab 01/15/18 0624  AST 32  ALT 40  ALKPHOS 116  BILITOT 0.7  PROT 7.5  ALBUMIN 1.9*   No results for input(s): LIPASE, AMYLASE in the last 168 hours. No results for input(s): AMMONIA in the last 168 hours.  CBC: Recent Labs  Lab 01/10/18 0502 01/15/18 0624 01/16/18 0621  WBC 10.9* 11.1* 8.4  HGB 7.9* 8.3* 7.4*  HCT 26.5* 28.0* 25.3*  MCV 93.6 96.2 96.2  PLT 289 271  174    Cardiac Enzymes: No results for input(s): CKTOTAL, CKMB, CKMBINDEX, TROPONINI in the last 168 hours.  BNP (last 3 results) No results for input(s): BNP in the last 8760 hours.  ProBNP (last 3 results) No results for input(s): PROBNP in the last 8760 hours.  Radiological Exams on Admission: Dg Chest Port 1 View  Result Date: 01/14/2018 CLINICAL DATA:  New onset fever. EXAM: PORTABLE CHEST 1 VIEW COMPARISON:  01/08/2018 FINDINGS: Tracheostomy in place, tip unchanged. The heart is normal in size. There are persistent airspace filling opacity throughout the lungs, unchanged over the recent exams. IMPRESSION: Stable appearance of bilateral airspace filling opacities. No new consolidations. Electronically Signed   By: Norva Pavlov M.D.   On: 01/14/2018 10:07   Dg Abd Portable 1v  Result Date: 01/16/2018 CLINICAL DATA:  Follow-up ileus. Postoperative intra-abdominal abscess. EXAM: PORTABLE ABDOMEN - 1 VIEW COMPARISON:  Abdominal radiograph of January 15, 2018 FINDINGS: There remain loops of moderately distended gas-filled transverse colon in the upper abdomen. The spine distended small bowel loops demonstrated on yesterday's study are not evident today. The stomach no longer fear appears gas-filled. The esophagogastric tube is in stable position with the tip projecting in the region of the pylorus. The small caliber drainage catheter in the right mid upper abdomen is stable where visualized. No rectal gas is observed. There is an inferior vena caval filter to the right of L1 and L2.  IMPRESSION: Decreased gastric and small bowel distention today possibly secondary to nasogastric suction. Persistent moderate distention of portions of the transverse colon. No evidence of perforation. Electronically Signed   By: David  [place]azilandJordan M.D.   On: 01/16/2018 11:24   Dg Abd Portable 1v  Result Date: 01/15/2018 CLINICAL DATA:  Follow-up ileus EXAM: PORTABLE ABDOMEN - 1 VIEW COMPARISON:  Abdominal radiograph  of January 01, 2018 and January 06, 2018 FINDINGS: There is esophagogastroscopy tube whose tip lies in the region of the pylorus and proximal port lies in the distal third of the stomach. There is gaseous distention of both small and large bowel in the upper and mid abdomen. No free extraluminal gas is observed. There is a pigtail drainage catheter in the right upper quadrant. There are surgical clips in the right upper quadrant and there is an inferior vena caval filter to the right of L2 and L3. There is partial compression of L2 and likely of L3. IMPRESSION: Reasonable positioning of the esophagogastric tube. Considerable small and large bowel distention with gas. No evidence of perforation observed. There is a drainage catheter in the right upper quadrant. Electronically Signed   By: David  [place]azil[place]Jordan M.D.   On: 01/15/2018 07:51    Assessment/Plan Active Problems:   Pulmonary emboli (HCC)   Acute DVT (deep venous thrombosis) (HCC)   Postoperative intra-abdominal abscess   Acute on chronic respiratory failure with hypoxia (HCC)   Acute cholecystitis   Lobar pneumonia (HCC)   Essential hypertension   1. Acute on chronic respiratory failure with hypoxia patient is not amenable at this time continue with full support on assist control as ordered continue with secretion management and titrate oxygen as tolerated 2. Candidemia patient's blood cultures were positive started on fluconazole which will be continued 3. 1.  Pneumonia treated with antibiotics 4. Postoperative intra-abdominal abscess has been treated 5. Pulmonary emboli continue with supportive care   I have personally seen and evaluated the patient, evaluated laboratory and imaging results, formulated the assessment and plan and placed orders. The Patient requires high complexity decision making for assessment and support.  Case was discussed on Rounds with the Respiratory Therapy Staff  Yevonne PaxSaadat A Nickol Collister, MD [facility] Pulmonary Critical Care  Medicine Sleep Medicine

## 2018-01-17 DIAGNOSIS — B377 Candidal sepsis: Secondary | ICD-10-CM | POA: Diagnosis not present

## 2018-01-17 DIAGNOSIS — T8149XA Infection following a procedure, other surgical site, initial encounter: Secondary | ICD-10-CM | POA: Diagnosis not present

## 2018-01-17 DIAGNOSIS — J9621 Acute and chronic respiratory failure with hypoxia: Secondary | ICD-10-CM | POA: Diagnosis not present

## 2018-01-17 DIAGNOSIS — I2699 Other pulmonary embolism without acute cor pulmonale: Secondary | ICD-10-CM | POA: Diagnosis not present

## 2018-01-17 DIAGNOSIS — J181 Lobar pneumonia, unspecified organism: Secondary | ICD-10-CM | POA: Diagnosis not present

## 2018-01-17 LAB — CBC
HCT: 24.8 % — ABNORMAL LOW (ref 39.0–52.0)
HEMOGLOBIN: 7.3 g/dL — AB (ref 13.0–17.0)
MCH: 28.4 pg (ref 26.0–34.0)
MCHC: 29.4 g/dL — AB (ref 30.0–36.0)
MCV: 96.5 fL (ref 78.0–100.0)
Platelets: 155 10*3/uL (ref 150–400)
RBC: 2.57 MIL/uL — ABNORMAL LOW (ref 4.22–5.81)
RDW: 18.7 % — ABNORMAL HIGH (ref 11.5–15.5)
WBC: 9 10*3/uL (ref 4.0–10.5)

## 2018-01-17 LAB — BASIC METABOLIC PANEL
Anion gap: 11 (ref 5–15)
BUN: 47 mg/dL — ABNORMAL HIGH (ref 6–20)
CHLORIDE: 108 mmol/L (ref 101–111)
CO2: 22 mmol/L (ref 22–32)
CREATININE: 2.83 mg/dL — AB (ref 0.61–1.24)
Calcium: 7.4 mg/dL — ABNORMAL LOW (ref 8.9–10.3)
GFR calc Af Amer: 23 mL/min — ABNORMAL LOW (ref >60)
GFR calc non Af Amer: 20 mL/min — ABNORMAL LOW (ref >60)
Glucose, Bld: 131 mg/dL — ABNORMAL HIGH (ref 65–99)
Potassium: 3.6 mmol/L (ref 3.5–5.1)
Sodium: 141 mmol/L (ref 135–145)

## 2018-01-17 LAB — PROTIME-INR
INR: 1.22
Prothrombin Time: 15.3 seconds — ABNORMAL HIGH (ref 11.4–15.2)

## 2018-01-17 LAB — URINALYSIS, ROUTINE W REFLEX MICROSCOPIC
Bilirubin Urine: NEGATIVE
Glucose, UA: NEGATIVE mg/dL
Ketones, ur: NEGATIVE mg/dL
Nitrite: NEGATIVE
Protein, ur: 100 mg/dL — AB
SPECIFIC GRAVITY, URINE: 1.011 (ref 1.005–1.030)
pH: 6 (ref 5.0–8.0)

## 2018-01-17 LAB — URINALYSIS, MICROSCOPIC (REFLEX)

## 2018-01-17 LAB — CULTURE, BLOOD (ROUTINE X 2): SPECIAL REQUESTS: ADEQUATE

## 2018-01-17 LAB — LACTIC ACID, PLASMA: Lactic Acid, Venous: 1 mmol/L (ref 0.5–1.9)

## 2018-01-17 NOTE — Progress Notes (Signed)
Pulmonary Critical Care Medicine [facility] [place]   PULMONARY SERVICE  PROGRESS NOTE  Date of Service: 01/17/2018  Rodney Cordova  ZOX:[id number]  DOB: [birth year]   DOA: 12/16/2017  Referring Physician: Carron Curie, MD  HPI: Rodney Cordova is a 77 y.o. male seen for follow up of Acute on Chronic Respiratory Failure.  Grossly unchanged continues to have fevers patient is nonverbal has not really shown much in the way of improvement  Medications: Reviewed on Rounds  Physical Exam:  Vitals: Temperature 100.0 pulse 124 respiratory rate 32 blood pressure 99/50 saturations 99%  Ventilator Settings mode of ventilation assist control FiO2 40% tidal volume 450 PEEP 5  . General: Comfortable at this time . Eyes: Grossly normal lids, irises & conjunctiva . ENT: grossly tongue is normal . Neck: no obvious mass . Cardiovascular: S1-S2 normal no gallop or rub . Respiratory: Scattered rhonchi expansion is equal . Abdomen: Soft nondistended . Skin: no rash seen on limited exam . Musculoskeletal: not rigid . Psychiatric:unable to assess . Neurologic: no seizure no involuntary movements         Labs on Admission:  Basic Metabolic Panel: Recent Labs  Lab 01/15/18 0624 01/16/18 0621 01/17/18 0540  NA 137 140 141  K 3.3* 3.0* 3.6  CL 104 108 108  CO2 22 20* 22  GLUCOSE 95 110* 131*  BUN 36* 41* 47*  CREATININE 2.11* 2.19* 2.83*  CALCIUM 7.7* 7.6* 7.4*    Liver Function Tests: Recent Labs  Lab 01/15/18 0624  AST 32  ALT 40  ALKPHOS 116  BILITOT 0.7  PROT 7.5  ALBUMIN 1.9*   No results for input(s): LIPASE, AMYLASE in the last 168 hours. No results for input(s): AMMONIA in the last 168 hours.  CBC: Recent Labs  Lab 01/15/18 0624 01/16/18 0621 01/17/18 0540  WBC 11.1* 8.4 9.0  HGB 8.3* 7.4* 7.3*  HCT 28.0* 25.3* 24.8*  MCV 96.2 96.2 96.5  PLT 271 174 155    Cardiac Enzymes: No results for input(s): CKTOTAL, CKMB, CKMBINDEX, TROPONINI in the last  168 hours.  BNP (last 3 results) No results for input(s): BNP in the last 8760 hours.  ProBNP (last 3 results) No results for input(s): PROBNP in the last 8760 hours.  Radiological Exams on Admission: Dg Chest Port 1 View  Result Date: 01/14/2018 CLINICAL DATA:  New onset fever. EXAM: PORTABLE CHEST 1 VIEW COMPARISON:  01/08/2018 FINDINGS: Tracheostomy in place, tip unchanged. The heart is normal in size. There are persistent airspace filling opacity throughout the lungs, unchanged over the recent exams. IMPRESSION: Stable appearance of bilateral airspace filling opacities. No new consolidations. Electronically Signed   By: Norva Pavlov M.D.   On: 01/14/2018 10:07   Dg Abd Portable 1v  Result Date: 01/16/2018 CLINICAL DATA:  Follow-up ileus. Postoperative intra-abdominal abscess. EXAM: PORTABLE ABDOMEN - 1 VIEW COMPARISON:  Abdominal radiograph of January 15, 2018 FINDINGS: There remain loops of moderately distended gas-filled transverse colon in the upper abdomen. The spine distended small bowel loops demonstrated on yesterday's study are not evident today. The stomach no longer fear appears gas-filled. The esophagogastric tube is in stable position with the tip projecting in the region of the pylorus. The small caliber drainage catheter in the right mid upper abdomen is stable where visualized. No rectal gas is observed. There is an inferior vena caval filter to the right of L1 and L2. IMPRESSION: Decreased gastric and small bowel distention today possibly secondary to nasogastric suction. Persistent moderate distention of  portions of the transverse colon. No evidence of perforation. Electronically Signed   By: David  [place]azil[place]Jordan M.D.   On: 01/16/2018 11:24   Dg Abd Portable 1v  Result Date: 01/15/2018 CLINICAL DATA:  Follow-up ileus EXAM: PORTABLE ABDOMEN - 1 VIEW COMPARISON:  Abdominal radiograph of January 01, 2018 and January 06, 2018 FINDINGS: There is esophagogastroscopy tube whose tip lies in the  region of the pylorus and proximal port lies in the distal third of the stomach. There is gaseous distention of both small and large bowel in the upper and mid abdomen. No free extraluminal gas is observed. There is a pigtail drainage catheter in the right upper quadrant. There are surgical clips in the right upper quadrant and there is an inferior vena caval filter to the right of L2 and L3. There is partial compression of L2 and likely of L3. IMPRESSION: Reasonable positioning of the esophagogastric tube. Considerable small and large bowel distention with gas. No evidence of perforation observed. There is a drainage catheter in the right upper quadrant. Electronically Signed   By: David  [place]azil[place]Jordan M.D.   On: 01/15/2018 07:51    Assessment/Plan Active Problems:   Pulmonary emboli (HCC)   Acute DVT (deep venous thrombosis) (HCC)   Postoperative intra-abdominal abscess   Acute on chronic respiratory failure with hypoxia (HCC)   Acute cholecystitis   Lobar pneumonia (HCC)   Essential hypertension   1. Acute on chronic respiratory failure with hypoxia we will continue with full vent support renal function seems to be getting a little bit worse may need to consider nephrology take a look at him. 2. Lobar pneumonia treated with antibiotics also started on antifungal therapy 3. Started on antifungal therapy will continue 4. Postoperative intra-abdominal abscess treated with antibiotics 5. Pulmonary emboli we will continue supportive care prognosis guarded   I have personally seen and evaluated the patient, evaluated laboratory and imaging results, formulated the assessment and plan and placed orders. The Patient requires high complexity decision making for assessment and support.  Case was discussed on Rounds with the Respiratory Therapy Staff patient remains critically ill and in danger of cardiac arrest 35 minutes  Yevonne PaxSaadat A Chauncy Mangiaracina, MD [facility] Pulmonary Critical Care Medicine Sleep Medicine

## 2018-01-18 DIAGNOSIS — J9621 Acute and chronic respiratory failure with hypoxia: Secondary | ICD-10-CM | POA: Diagnosis not present

## 2018-01-18 DIAGNOSIS — J181 Lobar pneumonia, unspecified organism: Secondary | ICD-10-CM | POA: Diagnosis not present

## 2018-01-18 DIAGNOSIS — I2699 Other pulmonary embolism without acute cor pulmonale: Secondary | ICD-10-CM | POA: Diagnosis not present

## 2018-01-18 DIAGNOSIS — T8149XA Infection following a procedure, other surgical site, initial encounter: Secondary | ICD-10-CM | POA: Diagnosis not present

## 2018-01-18 LAB — URINE CULTURE: Culture: >=100000 — AB

## 2018-01-18 LAB — CULTURE, RESPIRATORY W GRAM STAIN

## 2018-01-18 LAB — CULTURE, RESPIRATORY

## 2018-01-18 LAB — PROTIME-INR
INR: 1.37
Prothrombin Time: 16.7 seconds — ABNORMAL HIGH (ref 11.4–15.2)

## 2018-01-18 NOTE — Progress Notes (Signed)
[facility] [place]   PULMONARY SERVICE  PROGRESS NOTE  Date of Service: 01/18/2018  Rodney Cordova  WGN:[id number]  DOB: 08/24/1941   DOA: 12/16/2017  Referring Physician: Carron CurieAli Hijazi, MD  HPI: Rodney Cordova is a 77 y.o. male seen for follow up of Acute on Chronic Respiratory Failure.  Right now is on full support currently is on assist-control mode patient has been on 40% oxygen.  Not tolerating weaning at this time  Medications: Reviewed on Rounds  Physical Exam:  Vitals:  Temperature 97.1 degrees pulse 108 respiratory 30 blood pressure 148/70 saturations 96%  Ventilator Settings  Mode of ventilation assist-control FiO2 40% tidal volume 480 peep 5  . General: Comfortable at this time . Eyes: Grossly normal lids, irises & conjunctiva . ENT: grossly tongue is normal . Neck: no obvious mass . Cardiovascular:  S1-S2 normal no gallop . Respiratory:  No rhonchi noted bilaterally . Abdomen:  Soft nondistended . Skin: no rash seen on limited exam . Musculoskeletal: not rigid . Psychiatric:unable to assess . Neurologic: no seizure no involuntary movements         Labs on Admission:  Basic Metabolic Panel: Recent Labs  Lab 01/15/18 0624 01/16/18 0621 01/17/18 0540  NA 137 140 141  K 3.3* 3.0* 3.6  CL 104 108 108  CO2 22 20* 22  GLUCOSE 95 110* 131*  BUN 36* 41* 47*  CREATININE 2.11* 2.19* 2.83*  CALCIUM 7.7* 7.6* 7.4*    Liver Function Tests: Recent Labs  Lab 01/15/18 0624  AST 32  ALT 40  ALKPHOS 116  BILITOT 0.7  PROT 7.5  ALBUMIN 1.9*   No results for input(s): LIPASE, AMYLASE in the last 168 hours. No results for input(s): AMMONIA in the last 168 hours.  CBC: Recent Labs  Lab 01/15/18 0624 01/16/18 0621 01/17/18 0540  WBC 11.1* 8.4 9.0  HGB 8.3* 7.4* 7.3*  HCT 28.0* 25.3* 24.8*  MCV 96.2 96.2 96.5  PLT 271 174 155    Cardiac Enzymes: No results for input(s): CKTOTAL, CKMB, CKMBINDEX, TROPONINI in  the last 168 hours.  BNP (last 3 results) No results for input(s): BNP in the last 8760 hours.  ProBNP (last 3 results) No results for input(s): PROBNP in the last 8760 hours.  Radiological Exams on Admission: Dg Abd Portable 1v  Result Date: 01/16/2018 CLINICAL DATA:  Follow-up ileus. Postoperative intra-abdominal abscess. EXAM: PORTABLE ABDOMEN - 1 VIEW COMPARISON:  Abdominal radiograph of January 15, 2018 FINDINGS: There remain loops of moderately distended gas-filled transverse colon in the upper abdomen. The spine distended small bowel loops demonstrated on yesterday's study are not evident today. The stomach no longer fear appears gas-filled. The esophagogastric tube is in stable position with the tip projecting in the region of the pylorus. The small caliber drainage catheter in the right mid upper abdomen is stable where visualized. No rectal gas is observed. There is an inferior vena caval filter to the right of L1 and L2. IMPRESSION: Decreased gastric and small bowel distention today possibly secondary to nasogastric suction. Persistent moderate distention of portions of the transverse colon. No evidence of perforation. Electronically Signed   By: David  Swaziland[place] M.D.   On: 01/16/2018 11:24   Dg Abd Portable 1v  Result Date: 01/15/2018 CLINICAL DATA:  Follow-up ileus EXAM: PORTABLE ABDOMEN - 1 VIEW COMPARISON:  Abdominal radiograph of January 01, 2018 and January 06, 2018 FINDINGS: There is esophagogastroscopy tube whose tip lies in the region of the pylorus  and proximal port lies in the distal third of the stomach. There is gaseous distention of both small and large bowel in the upper and mid abdomen. No free extraluminal gas is observed. There is a pigtail drainage catheter in the right upper quadrant. There are surgical clips in the right upper quadrant and there is an inferior vena caval filter to the right of L2 and L3. There is partial compression of L2 and likely of L3. IMPRESSION: Reasonable  positioning of the esophagogastric tube. Considerable small and large bowel distention with gas. No evidence of perforation observed. There is a drainage catheter in the right upper quadrant. Electronically Signed   By: David  [place] M.D.   On: 01/15/2018 07:51    Assessment/Plan Active Problems:   Pulmonary emboli (HCC)   Acute DVT (deep venous thrombosis) (HCC)   Postoperative intra-abdominal abscess   Acute on chronic respiratory failure with hypoxia (HCC)   Acute cholecystitis   Lobar pneumonia (HCC)   Essential hypertension   1.  acute on chronic Respiratory failure with hypoxia failure to wean at this point will continue with full supportive care.  Continue on assist-control mode try to titrate FiO2 down as tolerated 2. Pulmonary emboli baseline continue to follow 3. Postoperative intra-abdominal abscess treated with antibiotics 4. Lobar pneumonia treated with antibiotics    I have personally seen and evaluated the patient, evaluated laboratory and imaging results, formulated the assessment and plan and placed orders. The Patient requires high complexity decision making for assessment and support.  Case was discussed on Rounds with the Respiratory Therapy Staff  Yevonne Pax, MD [facility] Pulmonary Critical Care Medicine Sleep Medicine

## 2018-01-19 DIAGNOSIS — J181 Lobar pneumonia, unspecified organism: Secondary | ICD-10-CM | POA: Diagnosis not present

## 2018-01-19 DIAGNOSIS — J9621 Acute and chronic respiratory failure with hypoxia: Secondary | ICD-10-CM | POA: Diagnosis not present

## 2018-01-19 DIAGNOSIS — I2699 Other pulmonary embolism without acute cor pulmonale: Secondary | ICD-10-CM | POA: Diagnosis not present

## 2018-01-19 DIAGNOSIS — T8149XA Infection following a procedure, other surgical site, initial encounter: Secondary | ICD-10-CM | POA: Diagnosis not present

## 2018-01-19 LAB — PROTIME-INR
INR: 1.24
Prothrombin Time: 15.5 seconds — ABNORMAL HIGH (ref 11.4–15.2)

## 2018-01-19 NOTE — Progress Notes (Signed)
Pulmonary Critical Care Medicine [facility] [place]   PULMONARY SERVICE  PROGRESS NOTE  Date of Service: 01/19/2018  Rodney Cordova  [id number]  DOB: 1941/08/21   DOA: 12/16/2017  Referring Physician: Carron Curie, MD  HPI: Rodney Cordova is a 77 y.o. male seen for follow up of Acute on Chronic Respiratory Failure.  Patient remains on full vent support not amenable.  Right now is on assist control FiO2 40% tidal volume 439  Medications: Reviewed on Rounds  Physical Exam:  Vitals: Temperature 97.5 pulse 104 respiratory rate 35 blood pressure 120/78 saturations 98%  Ventilator Settings mode of ventilation assist control FiO2 40% tidal volume 439 PEEP 5  . General: Comfortable at this time . Eyes: Grossly normal lids, irises & conjunctiva . ENT: grossly tongue is normal . Neck: no obvious mass . Cardiovascular: S1-S2 normal no gallop . Respiratory: No rhonchi expansion is equal . Abdomen: Soft and nontender . Skin: no rash seen on limited exam . Musculoskeletal: not rigid . Psychiatric:unable to assess . Neurologic: no seizure no involuntary movements         Labs on Admission:  Basic Metabolic Panel: Recent Labs  Lab 01/15/18 0624 01/16/18 0621 01/17/18 0540  NA 137 140 141  K 3.3* 3.0* 3.6  CL 104 108 108  CO2 22 20* 22  GLUCOSE 95 110* 131*  BUN 36* 41* 47*  CREATININE 2.11* 2.19* 2.83*  CALCIUM 7.7* 7.6* 7.4*    Liver Function Tests: Recent Labs  Lab 01/15/18 0624  AST 32  ALT 40  ALKPHOS 116  BILITOT 0.7  PROT 7.5  ALBUMIN 1.9*   No results for input(s): LIPASE, AMYLASE in the last 168 hours. No results for input(s): AMMONIA in the last 168 hours.  CBC: Recent Labs  Lab 01/15/18 0624 01/16/18 0621 01/17/18 0540  WBC 11.1* 8.4 9.0  HGB 8.3* 7.4* 7.3*  HCT 28.0* 25.3* 24.8*  MCV 96.2 96.2 96.5  PLT 271 174 155    Cardiac Enzymes: No results for input(s): CKTOTAL, CKMB, CKMBINDEX, TROPONINI in the last 168 hours.  BNP  (last 3 results) No results for input(s): BNP in the last 8760 hours.  ProBNP (last 3 results) No results for input(s): PROBNP in the last 8760 hours.  Radiological Exams on Admission: Dg Abd Portable 1v  Result Date: 01/16/2018 CLINICAL DATA:  Follow-up ileus. Postoperative intra-abdominal abscess. EXAM: PORTABLE ABDOMEN - 1 VIEW COMPARISON:  Abdominal radiograph of January 15, 2018 FINDINGS: There remain loops of moderately distended gas-filled transverse colon in the upper abdomen. The spine distended small bowel loops demonstrated on yesterday's study are not evident today. The stomach no longer fear appears gas-filled. The esophagogastric tube is in stable position with the tip projecting in the region of the pylorus. The small caliber drainage catheter in the right mid upper abdomen is stable where visualized. No rectal gas is observed. There is an inferior vena caval filter to the right of L1 and L2. IMPRESSION: Decreased gastric and small bowel distention today possibly secondary to nasogastric suction. Persistent moderate distention of portions of the transverse colon. No evidence of perforation. Electronically Signed   By: David  [place] M.D.   On: 01/16/2018 11:24    Assessment/Plan Active Problems:   Pulmonary emboli (HCC)   Acute DVT (deep venous thrombosis) (HCC)   Postoperative intra-abdominal abscess   Acute on chronic respiratory failure with hypoxia (HCC)   Acute cholecystitis   Lobar pneumonia (HCC)   Essential hypertension   1. Acute on chronic  respiratory failure with hypoxia patient is comfortable without distress.  Currently patient is not amenable to weaning 2. Postoperative intra-abdominal abscess we will continue supportive care no fevers are noted at this time. 3. Lobar pneumonia treated with antibiotics 4. Pulmonary embolism we will continue with supportive care   I have personally seen and evaluated the patient, evaluated laboratory and imaging results,  formulated the assessment and plan and placed orders. The Patient requires high complexity decision making for assessment and support.  Case was discussed on Rounds with the Respiratory Therapy Staff  Yevonne PaxSaadat A Rosamary Boudreau, MD [facility] Pulmonary Critical Care Medicine Sleep Medicine

## 2018-01-20 DIAGNOSIS — J181 Lobar pneumonia, unspecified organism: Secondary | ICD-10-CM | POA: Diagnosis not present

## 2018-01-20 DIAGNOSIS — J9621 Acute and chronic respiratory failure with hypoxia: Secondary | ICD-10-CM | POA: Diagnosis not present

## 2018-01-20 DIAGNOSIS — T8149XA Infection following a procedure, other surgical site, initial encounter: Secondary | ICD-10-CM | POA: Diagnosis not present

## 2018-01-20 DIAGNOSIS — I2699 Other pulmonary embolism without acute cor pulmonale: Secondary | ICD-10-CM | POA: Diagnosis not present

## 2018-01-20 LAB — CBC
HEMATOCRIT: 24.4 % — AB (ref 39.0–52.0)
HEMOGLOBIN: 7.2 g/dL — AB (ref 13.0–17.0)
MCH: 28.3 pg (ref 26.0–34.0)
MCHC: 29.5 g/dL — ABNORMAL LOW (ref 30.0–36.0)
MCV: 96.1 fL (ref 78.0–100.0)
Platelets: 129 10*3/uL — ABNORMAL LOW (ref 150–400)
RBC: 2.54 MIL/uL — ABNORMAL LOW (ref 4.22–5.81)
RDW: 18.6 % — AB (ref 11.5–15.5)
WBC: 8.7 10*3/uL (ref 4.0–10.5)

## 2018-01-20 LAB — PROTIME-INR
INR: 1.24
Prothrombin Time: 15.5 seconds — ABNORMAL HIGH (ref 11.4–15.2)

## 2018-01-20 LAB — BASIC METABOLIC PANEL
ANION GAP: 12 (ref 5–15)
BUN: 20 mg/dL (ref 6–20)
CHLORIDE: 108 mmol/L (ref 101–111)
CO2: 27 mmol/L (ref 22–32)
Calcium: 7.1 mg/dL — ABNORMAL LOW (ref 8.9–10.3)
Creatinine, Ser: 0.98 mg/dL (ref 0.61–1.24)
GFR calc Af Amer: >60 mL/min (ref >60)
GFR calc non Af Amer: >60 mL/min (ref >60)
Glucose, Bld: 173 mg/dL — ABNORMAL HIGH (ref 65–99)
POTASSIUM: 2.8 mmol/L — AB (ref 3.5–5.1)
SODIUM: 147 mmol/L — AB (ref 135–145)

## 2018-01-20 NOTE — Progress Notes (Signed)
Pulmonary Critical Care Medicine [facility] [place]   PULMONARY SERVICE  PROGRESS NOTE  Date of Service: 01/20/2018  Rodney Cordova  [id number]  DOB: August 25, 1941   DOA: 12/16/2017  Referring Physician: Carron Curie, MD  HPI: Rodney Cordova is a 77 y.o. male seen for follow up of Acute on Chronic Respiratory Failure.  Remains on full support currently is in assist control mode patient is on 40% oxygen right now.  PEEP is at 5 saturations were borderline at times  Medications: Reviewed on Rounds  Physical Exam:  Vitals: Temperature 97.0 pulse 108 respiratory rate 31 blood pressure 146/77 saturations 94%  Ventilator Settings mode of ventilation assist control FiO2 40% tidal volume 49 PEEP 5  . General: Comfortable at this time . Eyes: Grossly normal lids, irises & conjunctiva . ENT: grossly tongue is normal . Neck: no obvious mass . Cardiovascular: S1-S2 normal no gallop or rub . Respiratory: No rhonchi expansion is equal . Abdomen: Soft nondistended . Skin: no rash seen on limited exam . Musculoskeletal: not rigid . Psychiatric:unable to assess . Neurologic: no seizure no involuntary movements         Labs on Admission:  Basic Metabolic Panel: Recent Labs  Lab 01/15/18 0624 01/16/18 0621 01/17/18 0540 01/20/18 0728  NA 137 140 141 147*  K 3.3* 3.0* 3.6 2.8*  CL 104 108 108 108  CO2 22 20* 22 27  GLUCOSE 95 110* 131* 173*  BUN 36* 41* 47* 20  CREATININE 2.11* 2.19* 2.83* 0.98  CALCIUM 7.7* 7.6* 7.4* 7.1*    Liver Function Tests: Recent Labs  Lab 01/15/18 0624  AST 32  ALT 40  ALKPHOS 116  BILITOT 0.7  PROT 7.5  ALBUMIN 1.9*   No results for input(s): LIPASE, AMYLASE in the last 168 hours. No results for input(s): AMMONIA in the last 168 hours.  CBC: Recent Labs  Lab 01/15/18 0624 01/16/18 0621 01/17/18 0540 01/20/18 0728  WBC 11.1* 8.4 9.0 8.7  HGB 8.3* 7.4* 7.3* 7.2*  HCT 28.0* 25.3* 24.8* 24.4*  MCV 96.2 96.2 96.5 96.1  PLT 271  174 155 129*    Cardiac Enzymes: No results for input(s): CKTOTAL, CKMB, CKMBINDEX, TROPONINI in the last 168 hours.  BNP (last 3 results) No results for input(s): BNP in the last 8760 hours.  ProBNP (last 3 results) No results for input(s): PROBNP in the last 8760 hours.  Radiological Exams on Admission: Dg Abd Portable 1v  Result Date: 01/16/2018 CLINICAL DATA:  Follow-up ileus. Postoperative intra-abdominal abscess. EXAM: PORTABLE ABDOMEN - 1 VIEW COMPARISON:  Abdominal radiograph of January 15, 2018 FINDINGS: There remain loops of moderately distended gas-filled transverse colon in the upper abdomen. The spine distended small bowel loops demonstrated on yesterday's study are not evident today. The stomach no longer fear appears gas-filled. The esophagogastric tube is in stable position with the tip projecting in the region of the pylorus. The small caliber drainage catheter in the right mid upper abdomen is stable where visualized. No rectal gas is observed. There is an inferior vena caval filter to the right of L1 and L2. IMPRESSION: Decreased gastric and small bowel distention today possibly secondary to nasogastric suction. Persistent moderate distention of portions of the transverse colon. No evidence of perforation. Electronically Signed   By: David  [place] M.D.   On: 01/16/2018 11:24    Assessment/Plan Active Problems:   Pulmonary emboli (HCC)   Acute DVT (deep venous thrombosis) (HCC)   Postoperative intra-abdominal abscess   Acute on  chronic respiratory failure with hypoxia (HCC)   Acute cholecystitis   Lobar pneumonia (HCC)   Essential hypertension   1. Acute on chronic respiratory failure with hypoxia we will continue with full supportive care try to increase the PEEP up to a maximum of 10 to improve the oxygenation.  Patient's wife was in the room she was updated. 2. Postoperative intra-abdominal abscess has been treated with antibiotics 3. Lobar pneumonia treated with  antibiotics 4. Pulmonary emboli stable at this time we will continue to follow along   I have personally seen and evaluated the patient, evaluated laboratory and imaging results, formulated the assessment and plan and placed orders. The Patient requires high complexity decision making for assessment and support.  Case was discussed on Rounds with the Respiratory Therapy Staff  Saadat A Khan, MD [facility] Pulmonary Critical Care Medicine Sleep Medicine

## 2018-01-21 DIAGNOSIS — I2699 Other pulmonary embolism without acute cor pulmonale: Secondary | ICD-10-CM | POA: Diagnosis not present

## 2018-01-21 DIAGNOSIS — T8149XA Infection following a procedure, other surgical site, initial encounter: Secondary | ICD-10-CM | POA: Diagnosis not present

## 2018-01-21 DIAGNOSIS — J181 Lobar pneumonia, unspecified organism: Secondary | ICD-10-CM | POA: Diagnosis not present

## 2018-01-21 DIAGNOSIS — J9621 Acute and chronic respiratory failure with hypoxia: Secondary | ICD-10-CM | POA: Diagnosis not present

## 2018-01-21 LAB — POTASSIUM: Potassium: 3.7 mmol/L (ref 3.5–5.1)

## 2018-01-21 LAB — PROTIME-INR
INR: 1.09
PROTHROMBIN TIME: 14 s (ref 11.4–15.2)

## 2018-01-21 NOTE — Progress Notes (Signed)
Pulmonary Critical Care Medicine [facility] [place]   PULMONARY SERVICE  PROGRESS NOTE  Date of Service: 01/21/2018  Rodney Cordova  [id number]  DOB: 05-23-1941   DOA: 12/16/2017  Referring Physician: Carron Curie, MD  HPI: Rodney Cordova is a 77 y.o. male seen for follow up of Acute on Chronic Respiratory Failure.  Patient remains on the ventilator full support has been doing little bit better with increased PEEP is a little bit more awake also today.  Medications: Reviewed on Rounds  Physical Exam:  Vitals: Temperature 97.8 pulse 102 respiratory rate 22 blood pressure 139/71 saturations 98%  Ventilator Settings mode of ventilation assist control FiO2 40% tidal volume 465 PEEP of 8  . General: Comfortable at this time . Eyes: Grossly normal lids, irises & conjunctiva . ENT: grossly tongue is normal . Neck: no obvious mass . Cardiovascular: S1-S2 normal no gallop or rub . Respiratory: No rhonchi expansion is equal . Abdomen: Obese and soft . Skin: no rash seen on limited exam . Musculoskeletal: not rigid . Psychiatric:unable to assess . Neurologic: no seizure no involuntary movements         Labs on Admission:  Basic Metabolic Panel: Recent Labs  Lab 01/15/18 0624 01/16/18 0621 01/17/18 0540 01/20/18 0728 01/21/18 0641  NA 137 140 141 147*  --   K 3.3* 3.0* 3.6 2.8* 3.7  CL 104 108 108 108  --   CO2 22 20* 22 27  --   GLUCOSE 95 110* 131* 173*  --   BUN 36* 41* 47* 20  --   CREATININE 2.11* 2.19* 2.83* 0.98  --   CALCIUM 7.7* 7.6* 7.4* 7.1*  --     Liver Function Tests: Recent Labs  Lab 01/15/18 0624  AST 32  ALT 40  ALKPHOS 116  BILITOT 0.7  PROT 7.5  ALBUMIN 1.9*   No results for input(s): LIPASE, AMYLASE in the last 168 hours. No results for input(s): AMMONIA in the last 168 hours.  CBC: Recent Labs  Lab 01/15/18 0624 01/16/18 0621 01/17/18 0540 01/20/18 0728  WBC 11.1* 8.4 9.0 8.7  HGB 8.3* 7.4* 7.3* 7.2*  HCT 28.0* 25.3*  24.8* 24.4*  MCV 96.2 96.2 96.5 96.1  PLT 271 174 155 129*    Cardiac Enzymes: No results for input(s): CKTOTAL, CKMB, CKMBINDEX, TROPONINI in the last 168 hours.  BNP (last 3 results) No results for input(s): BNP in the last 8760 hours.  ProBNP (last 3 results) No results for input(s): PROBNP in the last 8760 hours.  Radiological Exams on Admission: No results found.  Assessment/Plan Active Problems:   Pulmonary emboli (HCC)   Acute DVT (deep venous thrombosis) (HCC)   Postoperative intra-abdominal abscess   Acute on chronic respiratory failure with hypoxia (HCC)   Acute cholecystitis   Lobar pneumonia (HCC)   Essential hypertension   1. Acute on chronic respiratory failure with hypoxia we will continue to assess the hours as needed try to wean if at all possible.  Continue with secretion management pulmonary toilet follow along 2. Lobar pneumonia treated with antibiotics we will continue supportive care 3. Pulmonary emboli treated will follow 4. Postoperative intra-abdominal abscess no fever noted we will continue with supportive care   I have personally seen and evaluated the patient, evaluated laboratory and imaging results, formulated the assessment and plan and placed orders. The Patient requires high complexity decision making for assessment and support.  Case was discussed on Rounds with the Respiratory Therapy Staff  Saadat A  Humphrey Rolls, MD [facility] Pulmonary Critical Care Medicine Sleep Medicine

## 2018-01-22 DIAGNOSIS — I2699 Other pulmonary embolism without acute cor pulmonale: Secondary | ICD-10-CM | POA: Diagnosis not present

## 2018-01-22 DIAGNOSIS — J181 Lobar pneumonia, unspecified organism: Secondary | ICD-10-CM | POA: Diagnosis not present

## 2018-01-22 DIAGNOSIS — J9621 Acute and chronic respiratory failure with hypoxia: Secondary | ICD-10-CM | POA: Diagnosis not present

## 2018-01-22 DIAGNOSIS — T8149XA Infection following a procedure, other surgical site, initial encounter: Secondary | ICD-10-CM | POA: Diagnosis not present

## 2018-01-22 LAB — PROTIME-INR
INR: 1.24
Prothrombin Time: 15.5 seconds — ABNORMAL HIGH (ref 11.4–15.2)

## 2018-01-22 NOTE — Progress Notes (Signed)
Pulmonary Critical Care Medicine [facility] [place]   PULMONARY SERVICE  PROGRESS NOTE  Date of Service: 01/22/2018  Rodney Cordova  [id number]  DOB: February 24, 1941   DOA: 12/16/2017  Referring Physician: Carron Curie, MD  HPI: Rodney Cordova is a 77 y.o. male seen for follow up of Acute on Chronic Respiratory Failure.  Comfortable without distress at this time remains on full vent support.  Patient is not able to wean.  Wife was present in the room and she was updated  Medications: Reviewed on Rounds  Physical Exam:  Vitals: Temperature 98.4 pulse 92 respiratory rate 20 blood pressure 144/76 saturations 96%  Ventilator Settings mode of ventilation assist control FiO2 40% tidal volume 471 PEEP of 8  . General: Comfortable at this time . Eyes: Grossly normal lids, irises & conjunctiva . ENT: grossly tongue is normal . Neck: no obvious mass . Cardiovascular: S1-S2 normal no gallop or rub . Respiratory: No rhonchi noted . Abdomen: Soft nondistended . Skin: no rash seen on limited exam . Musculoskeletal: not rigid . Psychiatric:unable to assess . Neurologic: no seizure no involuntary movements         Labs on Admission:  Basic Metabolic Panel: Recent Labs  Lab 01/16/18 0621 01/17/18 0540 01/20/18 0728 01/21/18 0641  NA 140 141 147*  --   K 3.0* 3.6 2.8* 3.7  CL 108 108 108  --   CO2 20* 22 27  --   GLUCOSE 110* 131* 173*  --   BUN 41* 47* 20  --   CREATININE 2.19* 2.83* 0.98  --   CALCIUM 7.6* 7.4* 7.1*  --     Liver Function Tests: No results for input(s): AST, ALT, ALKPHOS, BILITOT, PROT, ALBUMIN in the last 168 hours. No results for input(s): LIPASE, AMYLASE in the last 168 hours. No results for input(s): AMMONIA in the last 168 hours.  CBC: Recent Labs  Lab 01/16/18 0621 01/17/18 0540 01/20/18 0728  WBC 8.4 9.0 8.7  HGB 7.4* 7.3* 7.2*  HCT 25.3* 24.8* 24.4*  MCV 96.2 96.5 96.1  PLT 174 155 129*    Cardiac Enzymes: No results for  input(s): CKTOTAL, CKMB, CKMBINDEX, TROPONINI in the last 168 hours.  BNP (last 3 results) No results for input(s): BNP in the last 8760 hours.  ProBNP (last 3 results) No results for input(s): PROBNP in the last 8760 hours.  Radiological Exams on Admission: No results found.  Assessment/Plan Active Problems:   Pulmonary emboli (HCC)   Acute DVT (deep venous thrombosis) (HCC)   Postoperative intra-abdominal abscess   Acute on chronic respiratory failure with hypoxia (HCC)   Acute cholecystitis   Lobar pneumonia (HCC)   Essential hypertension   1. Acute on chronic respiratory failure with hypoxia we will continue with full vent support patient's attempted on pressure support did not tolerate.  Will continue secretion management pulmonary toilet 2. Lobar pneumonia treated with antibiotics we will continue to follow 3. Pulmonary emboli treated we will continue supportive care 4. Postoperative intra-abdominal abscess treated with antibiotics we will continue to monitor   I have personally seen and evaluated the patient, evaluated laboratory and imaging results, formulated the assessment and plan and placed orders. The Patient requires high complexity decision making for assessment and support.  Case was discussed on Rounds with the Respiratory Therapy Staff  Yevonne Pax, MD [facility] Pulmonary Critical Care Medicine Sleep Medicine

## 2018-01-23 DIAGNOSIS — T8149XA Infection following a procedure, other surgical site, initial encounter: Secondary | ICD-10-CM | POA: Diagnosis not present

## 2018-01-23 DIAGNOSIS — I2699 Other pulmonary embolism without acute cor pulmonale: Secondary | ICD-10-CM | POA: Diagnosis not present

## 2018-01-23 DIAGNOSIS — J9621 Acute and chronic respiratory failure with hypoxia: Secondary | ICD-10-CM | POA: Diagnosis not present

## 2018-01-23 DIAGNOSIS — J181 Lobar pneumonia, unspecified organism: Secondary | ICD-10-CM | POA: Diagnosis not present

## 2018-01-23 LAB — PROTIME-INR
INR: 1.28
Prothrombin Time: 15.9 seconds — ABNORMAL HIGH (ref 11.4–15.2)

## 2018-01-23 NOTE — Progress Notes (Signed)
Pulmonary Critical Care Medicine [facility] [place]   PULMONARY SERVICE  PROGRESS NOTE  Date of Service: 01/23/2018  Rodney Cordova  [id number]  DOB: 11-04-1940   DOA: 12/16/2017  Referring Physician: Carron Curie, MD  HPI: Rodney Cordova is a 77 y.o. male seen for follow up of Acute on Chronic Respiratory Failure.  Patient continues with full support has been on assist control mode currently is on 35% oxygen with a PEEP of 8  Medications: Reviewed on Rounds  Physical Exam:  Vitals: Temperature 97.6 pulse 99 respiratory rate 23 blood pressure 132/74 saturations are 91%  Ventilator Settings mode of ventilation assist control FiO2 35% tidal volume 47 PEEP 8  . General: Comfortable at this time . Eyes: Grossly normal lids, irises & conjunctiva . ENT: grossly tongue is normal . Neck: no obvious mass . Cardiovascular: S1-S2 normal no gallop . Respiratory: Scattered rhonchi . Abdomen: Soft nontender . Skin: no rash seen on limited exam . Musculoskeletal: not rigid . Psychiatric:unable to assess . Neurologic: no seizure no involuntary movements         Labs on Admission:  Basic Metabolic Panel: Recent Labs  Lab 01/17/18 0540 01/20/18 0728 01/21/18 0641  NA 141 147*  --   K 3.6 2.8* 3.7  CL 108 108  --   CO2 22 27  --   GLUCOSE 131* 173*  --   BUN 47* 20  --   CREATININE 2.83* 0.98  --   CALCIUM 7.4* 7.1*  --     Liver Function Tests: No results for input(s): AST, ALT, ALKPHOS, BILITOT, PROT, ALBUMIN in the last 168 hours. No results for input(s): LIPASE, AMYLASE in the last 168 hours. No results for input(s): AMMONIA in the last 168 hours.  CBC: Recent Labs  Lab 01/17/18 0540 01/20/18 0728  WBC 9.0 8.7  HGB 7.3* 7.2*  HCT 24.8* 24.4*  MCV 96.5 96.1  PLT 155 129*    Cardiac Enzymes: No results for input(s): CKTOTAL, CKMB, CKMBINDEX, TROPONINI in the last 168 hours.  BNP (last 3 results) No results for input(s): BNP in the last 8760  hours.  ProBNP (last 3 results) No results for input(s): PROBNP in the last 8760 hours.  Radiological Exams on Admission: No results found.  Assessment/Plan Active Problems:   Pulmonary emboli (HCC)   Acute DVT (deep venous thrombosis) (HCC)   Postoperative intra-abdominal abscess   Acute on chronic respiratory failure with hypoxia (HCC)   Acute cholecystitis   Lobar pneumonia (HCC)   Essential hypertension   1. Acute on chronic respiratory failure with hypoxia we will continue with full vent support they are SBI mechanics are extremely poor patient is not able to wean we will continue to follow along 2. Lobar pneumonia treated with antibiotics clinically still fully vent dependent we will continue to follow 3. Pulmonary embolism treated we will follow along 4. Postoperative intra-abdominal abscess now is afebrile   I have personally seen and evaluated the patient, evaluated laboratory and imaging results, formulated the assessment and plan and placed orders. The Patient requires high complexity decision making for assessment and support.  Case was discussed on Rounds with the Respiratory Therapy Staff  Yevonne Pax, MD [facility] Pulmonary Critical Care Medicine Sleep Medicine

## 2018-01-24 DIAGNOSIS — I2699 Other pulmonary embolism without acute cor pulmonale: Secondary | ICD-10-CM | POA: Diagnosis not present

## 2018-01-24 DIAGNOSIS — T8149XA Infection following a procedure, other surgical site, initial encounter: Secondary | ICD-10-CM | POA: Diagnosis not present

## 2018-01-24 DIAGNOSIS — J181 Lobar pneumonia, unspecified organism: Secondary | ICD-10-CM | POA: Diagnosis not present

## 2018-01-24 DIAGNOSIS — J9621 Acute and chronic respiratory failure with hypoxia: Secondary | ICD-10-CM | POA: Diagnosis not present

## 2018-01-24 LAB — BASIC METABOLIC PANEL
ANION GAP: 10 (ref 5–15)
BUN: 12 mg/dL (ref 6–20)
CALCIUM: 7.5 mg/dL — AB (ref 8.9–10.3)
CO2: 41 mmol/L — AB (ref 22–32)
CREATININE: 0.94 mg/dL (ref 0.61–1.24)
Chloride: 101 mmol/L (ref 101–111)
GFR calc Af Amer: >60 mL/min (ref >60)
GFR calc non Af Amer: >60 mL/min (ref >60)
GLUCOSE: 120 mg/dL — AB (ref 65–99)
Potassium: 3 mmol/L — ABNORMAL LOW (ref 3.5–5.1)
Sodium: 152 mmol/L — ABNORMAL HIGH (ref 135–145)

## 2018-01-24 LAB — CBC
HEMATOCRIT: 25.2 % — AB (ref 39.0–52.0)
Hemoglobin: 7 g/dL — ABNORMAL LOW (ref 13.0–17.0)
MCH: 28.1 pg (ref 26.0–34.0)
MCHC: 27.8 g/dL — AB (ref 30.0–36.0)
MCV: 101.2 fL — AB (ref 78.0–100.0)
Platelets: 215 10*3/uL (ref 150–400)
RBC: 2.49 MIL/uL — ABNORMAL LOW (ref 4.22–5.81)
RDW: 18.8 % — AB (ref 11.5–15.5)
WBC: 9.4 10*3/uL (ref 4.0–10.5)

## 2018-01-24 LAB — PROTIME-INR
INR: 1.26
Prothrombin Time: 15.7 seconds — ABNORMAL HIGH (ref 11.4–15.2)

## 2018-01-24 NOTE — Progress Notes (Signed)
Pulmonary Critical Care Medicine [facility] [place]   PULMONARY SERVICE  PROGRESS NOTE  Date of Service: 01/24/2018  Rodney Cordova  ZOX:[id number]  DOB: May 19, 1941   DOA: 12/16/2017  Referring Physician: Carron Curie, MD  HPI: Rodney Cordova is a 77 y.o. male seen for follow up of Acute on Chronic Respiratory Failure.  Patient is on full support currently assist control mode has been on 20% oxygen.  Mechanics have been poor still not able to wean  Medications: Reviewed on Rounds  Physical Exam:  Vitals: Temperature 98.3 pulse 105 respiratory rate 24 blood pressure 149/70 saturation 96%  Ventilator Settings mode of ventilation assist control FiO2 20% tidal volume 450 PEEP 8  . General: Comfortable at this time . Eyes: Grossly normal lids, irises & conjunctiva . ENT: grossly tongue is normal . Neck: no obvious mass . Cardiovascular: S1-S2 normal no gallop . Respiratory: Distant rhonchi . Abdomen: No distention . Skin: no rash seen on limited exam . Musculoskeletal: not rigid . Psychiatric:unable to assess . Neurologic: no seizure no involuntary movements         Labs on Admission:  Basic Metabolic Panel: Recent Labs  Lab 01/20/18 0728 01/21/18 0641 01/24/18 0527  NA 147*  --  152*  K 2.8* 3.7 3.0*  CL 108  --  101  CO2 27  --  41*  GLUCOSE 173*  --  120*  BUN 20  --  12  CREATININE 0.98  --  0.94  CALCIUM 7.1*  --  7.5*    Liver Function Tests: No results for input(s): AST, ALT, ALKPHOS, BILITOT, PROT, ALBUMIN in the last 168 hours. No results for input(s): LIPASE, AMYLASE in the last 168 hours. No results for input(s): AMMONIA in the last 168 hours.  CBC: Recent Labs  Lab 01/20/18 0728 01/24/18 0527  WBC 8.7 9.4  HGB 7.2* 7.0*  HCT 24.4* 25.2*  MCV 96.1 101.2*  PLT 129* 215    Cardiac Enzymes: No results for input(s): CKTOTAL, CKMB, CKMBINDEX, TROPONINI in the last 168 hours.  BNP (last 3 results) No results for input(s): BNP in the  last 8760 hours.  ProBNP (last 3 results) No results for input(s): PROBNP in the last 8760 hours.  Radiological Exams on Admission: No results found.  Assessment/Plan Active Problems:   Pulmonary emboli (HCC)   Acute DVT (deep venous thrombosis) (HCC)   Postoperative intra-abdominal abscess   Acute on chronic respiratory failure with hypoxia (HCC)   Acute cholecystitis   Lobar pneumonia (HCC)   Essential hypertension   1. Acute on chronic respiratory failure with hypoxia not amenable at this time we will continue with assist control mode hours again mechanics have been poor.  We will try to decrease the PEEP down to 5 today 2. Pulmonary emboli treated we will continue to monitor 3. Lobar pneumonia treated with antibiotics we will continue with supportive care 4. Postoperative intra-abdominal abscess continue to watch closely   I have personally seen and evaluated the patient, evaluated laboratory and imaging results, formulated the assessment and plan and placed orders. The Patient requires high complexity decision making for assessment and support.  Case was discussed on Rounds with the Respiratory Therapy Staff  Yevonne Pax, MD [facility] Pulmonary Critical Care Medicine Sleep Medicine

## 2018-01-24 NOTE — Progress Notes (Signed)
Referring Physician(s): Dr Sharyon Medicus  Supervising Physician: Simonne Come  Patient Status:  Select IP  Chief Complaint:  Dysphagia Malnutrition   Subjective:  01/09/18 note: History of Present Illness: Rodney Cordova is a 77 y.o. 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 No new abscess collections identified. Sigmoid diverticulosis without diverticulitis changes. Small BILATERAL inguinal hernias containing fat. No new intra-abdominal or intrapelvic abnormalities.  Dr Grace Isaac reviewed imaging and has approved pt for G tube at his time Afeb; wbc 12 Dr Grace Isaac has also approved abscess drain injection-- removal if deemed appropriate with IR MD   Was scheduled for G tube in IR  But new + [facility] 4/21 Repeat BC 4/30 NGTD UTI 4/24: treated  Now wbc wnl Afeb Rescheduled gastric tube placement for 5/2 Also for possible RUQ abscess drain removal   Allergies: Patient has no allergy information on record.  Medications: Prior to Admission medications   Not on File     Vital Signs: There were no vitals taken for this visit.  Physical Exam  Cardiovascular: Normal rate and regular rhythm.  Pulmonary/Chest: Breath sounds normal.  Trach  Abdominal: Soft. Bowel sounds are normal.  Skin: Skin is warm and dry.  Psychiatric:  Consented with wife at bedside  Nursing note and vitals reviewed.   Imaging: No results found.  Labs:  CBC: Recent Labs    01/16/18 0621  01/17/18 0540 01/20/18 0728 01/24/18 0527  WBC 8.4 9.0 8.7 9.4  HGB 7.4* 7.3* 7.2* 7.0*  HCT 25.3* 24.8* 24.4* 25.2*  PLT 174 155 129* 215    COAGS: Recent Labs    12/16/17 1603  01/21/18 0641 01/22/18 0827 01/23/18 0814 01/24/18 0527  INR 1.33   < > 1.09 1.24 1.28 1.26  APTT 120*  --   --   --   --   --    < > = values in this interval not displayed.    BMP: Recent Labs    01/16/18 0621 01/17/18 0540 01/20/18 0728 01/21/18 0641 01/24/18 0527  NA 140 141 147*  --  152*  K 3.0* 3.6 2.8* 3.7 3.0*  CL 108 108 108  --  101  CO2 20* 22 27  --  41*  GLUCOSE 110* 131* 173*  --  120*  BUN 41* 47* 20  --  12  CALCIUM 7.6* 7.4* 7.1*  --  7.5*  CREATININE 2.19* 2.83* 0.98  --  0.94  GFRNONAA 28* 20* >60  --  >60  GFRAA 32* 23* >60  --  >60    LIVER FUNCTION TESTS: Recent Labs    12/17/17 0739 01/15/18 0624  BILITOT 0.4 0.7  AST 89* 32  ALT 97* 40  ALKPHOS 105 116  PROT 6.8 7.5  ALBUMIN 1.4* 1.9*    Assessment and Plan:  Dysphagia; malnutrition Scheduled for percutaneous gastric tube placement ** also for RUQ drain injection and possible removal** Risks and benefits discussed with the patient's wife including, but not limited to the need for a barium enema during the procedure, bleeding, infection, peritonitis, or damage to adjacent  structures.  All of the patient's wifes questions were answered, she is agreeable to proceed. Consent signed and in chart.   Electronically Signed: Robet Leu, PA-C 01/24/2018, 1:22 PM   I spent a total of 25 Minutes at the the patient's bedside AND on the patient's hospital floor or unit, greater than 50% of which was counseling/coordinating care for perc G tube placement

## 2018-01-25 ENCOUNTER — Other Ambulatory Visit (HOSPITAL_COMMUNITY): Payer: Medicare Other

## 2018-01-25 ENCOUNTER — Encounter (HOSPITAL_COMMUNITY): Payer: Self-pay | Admitting: Interventional Radiology

## 2018-01-25 DIAGNOSIS — J9621 Acute and chronic respiratory failure with hypoxia: Secondary | ICD-10-CM | POA: Diagnosis not present

## 2018-01-25 DIAGNOSIS — J181 Lobar pneumonia, unspecified organism: Secondary | ICD-10-CM | POA: Diagnosis not present

## 2018-01-25 DIAGNOSIS — T8149XA Infection following a procedure, other surgical site, initial encounter: Secondary | ICD-10-CM | POA: Diagnosis not present

## 2018-01-25 DIAGNOSIS — I2699 Other pulmonary embolism without acute cor pulmonale: Secondary | ICD-10-CM | POA: Diagnosis not present

## 2018-01-25 HISTORY — PX: IR SINUS/FIST TUBE CHK-NON GI: IMG673

## 2018-01-25 HISTORY — PX: IR GASTROSTOMY TUBE MOD SED: IMG625

## 2018-01-25 LAB — POTASSIUM: POTASSIUM: 3.9 mmol/L (ref 3.5–5.1)

## 2018-01-25 LAB — PROTIME-INR
INR: 1.25
PROTHROMBIN TIME: 15.6 s — AB (ref 11.4–15.2)

## 2018-01-25 MED ORDER — FENTANYL CITRATE (PF) 100 MCG/2ML IJ SOLN
INTRAMUSCULAR | Status: AC | PRN
  Administered 2018-01-25: 50 ug via INTRAVENOUS

## 2018-01-25 MED ORDER — IOPAMIDOL (ISOVUE-300) INJECTION 61%
INTRAVENOUS | Status: AC
  Administered 2018-01-25: 10 mL
  Filled 2018-01-25: qty 50

## 2018-01-25 MED ORDER — LIDOCAINE VISCOUS 2 % MT SOLN
OROMUCOSAL | Status: AC | PRN
  Administered 2018-01-25: 15 mL via OROMUCOSAL

## 2018-01-25 MED ORDER — LIDOCAINE HCL 1 % IJ SOLN
INTRAMUSCULAR | Status: AC
  Filled 2018-01-25: qty 20

## 2018-01-25 MED ORDER — LIDOCAINE HCL (PF) 1 % IJ SOLN
INTRAMUSCULAR | Status: AC | PRN
  Administered 2018-01-25: 10 mL

## 2018-01-25 MED ORDER — CEFAZOLIN SODIUM-DEXTROSE 2-4 GM/100ML-% IV SOLN
INTRAVENOUS | Status: AC
  Filled 2018-01-25: qty 100

## 2018-01-25 MED ORDER — FENTANYL CITRATE (PF) 100 MCG/2ML IJ SOLN
INTRAMUSCULAR | Status: AC
  Filled 2018-01-25: qty 2

## 2018-01-25 MED ORDER — LIDOCAINE VISCOUS 2 % MT SOLN
OROMUCOSAL | Status: AC
  Filled 2018-01-25: qty 15

## 2018-01-25 MED ORDER — CEFAZOLIN SODIUM-DEXTROSE 2-4 GM/100ML-% IV SOLN
2.0000 g | Freq: Three times a day (TID) | INTRAVENOUS | Status: DC
  Administered 2018-01-25: 2 g via INTRAVENOUS

## 2018-01-25 MED ORDER — MIDAZOLAM HCL 2 MG/2ML IJ SOLN
INTRAMUSCULAR | Status: AC | PRN
  Administered 2018-01-25: 1 mg via INTRAVENOUS

## 2018-01-25 MED ORDER — MIDAZOLAM HCL 2 MG/2ML IJ SOLN
INTRAMUSCULAR | Status: AC
  Filled 2018-01-25: qty 2

## 2018-01-25 NOTE — Procedures (Signed)
Interventional Radiology Procedure Note  Procedure: Placement of percutaneous 59F pull-through gastrostomy tube.  Injection of the RUQ drain shows no residual collection.  Contrast exited along the tract.  Drain was removed.   Complications: None  Recommendations: - NPO except for sips and chips remainder of today and overnight - Maintain G-tube to LWS until tomorrow morning  - May advance diet as tolerated and begin using tube tomorrow morning  Signed,   Yvone Neu. Loreta Ave, DO

## 2018-01-25 NOTE — Progress Notes (Signed)
Pulmonary Critical Care Medicine [facility] [place]   PULMONARY SERVICE  PROGRESS NOTE  Date of Service: 01/25/2018  Rodney Cordova  [id number]  DOB: 13-Apr-1941   DOA: 12/16/2017  Referring Physician: Carron Curie, MD  HPI: Rodney Cordova is a 77 y.o. male seen for follow up of Acute on Chronic Respiratory Failure.  Currently patient is on full vent support.  Scheduled for a PEG placement today  Medications: Reviewed on Rounds  Physical Exam:  Vitals: Temperature 97.1 pulse 97 respiratory rate 12 blood pressure 133/77 saturation 98%  Ventilator Settings mode of ventilation assist control FiO2 35% tidal volume 480 PEEP 5  . General: Comfortable at this time . Eyes: Grossly normal lids, irises & conjunctiva . ENT: grossly tongue is normal . Neck: no obvious mass . Cardiovascular: S1-S2 normal no gallop rub . Respiratory: No rhonchi . Abdomen: Soft nontender . Skin: no rash seen on limited exam . Musculoskeletal: not rigid . Psychiatric:unable to assess . Neurologic: no seizure no involuntary movements         Labs on Admission:  Basic Metabolic Panel: Recent Labs  Lab 01/20/18 0728 01/21/18 0641 01/24/18 0527 01/25/18 1122  NA 147*  --  152*  --   K 2.8* 3.7 3.0* 3.9  CL 108  --  101  --   CO2 27  --  41*  --   GLUCOSE 173*  --  120*  --   BUN 20  --  12  --   CREATININE 0.98  --  0.94  --   CALCIUM 7.1*  --  7.5*  --     Liver Function Tests: No results for input(s): AST, ALT, ALKPHOS, BILITOT, PROT, ALBUMIN in the last 168 hours. No results for input(s): LIPASE, AMYLASE in the last 168 hours. No results for input(s): AMMONIA in the last 168 hours.  CBC: Recent Labs  Lab 01/20/18 0728 01/24/18 0527  WBC 8.7 9.4  HGB 7.2* 7.0*  HCT 24.4* 25.2*  MCV 96.1 101.2*  PLT 129* 215    Cardiac Enzymes: No results for input(s): CKTOTAL, CKMB, CKMBINDEX, TROPONINI in the last 168 hours.  BNP (last 3 results) No results for input(s): BNP in the  last 8760 hours.  ProBNP (last 3 results) No results for input(s): PROBNP in the last 8760 hours.  Radiological Exams on Admission: No results found.  Assessment/Plan Active Problems:   Pulmonary emboli (HCC)   Acute DVT (deep venous thrombosis) (HCC)   Postoperative intra-abdominal abscess   Acute on chronic respiratory failure with hypoxia (HCC)   Acute cholecystitis   Lobar pneumonia (HCC)   Essential hypertension   1. Acute on chronic respiratory failure with hypoxia continue with assist control mode titrate oxygen as tolerated. 2. Lobar pneumonia treated with antibiotics 3. Postoperative intra-abdominal abscess stable at this time we will continue present management 4. Pulmonary emboli treated   I have personally seen and evaluated the patient, evaluated laboratory and imaging results, formulated the assessment and plan and placed orders. The Patient requires high complexity decision making for assessment and support.  Case was discussed on Rounds with the Respiratory Therapy Staff  Yevonne Pax, MD [facility] Pulmonary Critical Care Medicine Sleep Medicine

## 2018-01-25 NOTE — Sedation Documentation (Signed)
Unable to monitor cO2 level due to patient on ventilator. Respiratory at bedside monitoring ventilator.

## 2018-01-26 DIAGNOSIS — J181 Lobar pneumonia, unspecified organism: Secondary | ICD-10-CM | POA: Diagnosis not present

## 2018-01-26 DIAGNOSIS — J9621 Acute and chronic respiratory failure with hypoxia: Secondary | ICD-10-CM | POA: Diagnosis not present

## 2018-01-26 DIAGNOSIS — T8149XA Infection following a procedure, other surgical site, initial encounter: Secondary | ICD-10-CM | POA: Diagnosis not present

## 2018-01-26 DIAGNOSIS — I2699 Other pulmonary embolism without acute cor pulmonale: Secondary | ICD-10-CM | POA: Diagnosis not present

## 2018-01-26 LAB — PROTIME-INR
INR: 1.11
PROTHROMBIN TIME: 14.2 s (ref 11.4–15.2)

## 2018-01-26 NOTE — Progress Notes (Signed)
Pulmonary Critical Care Medicine [facility] [place]   PULMONARY SERVICE  PROGRESS NOTE  Date of Service: 01/26/2018  Rodney Cordova  [id number]  DOB: 1940/12/02   DOA: 12/16/2017  Referring Physician: Carron Curie, MD  HPI: Rodney Cordova is a 77 y.o. male seen for follow up of Acute on Chronic Respiratory Failure.  Patient had PEG placed remains on the ventilator not yet started on wean.  Currently is on assist control mode  Medications: Reviewed on Rounds  Physical Exam:  Vitals: Temperature 97.7 pulse 91 respiratory rate 30 blood pressure 122/70 saturations 96%  Ventilator Settings on assist control FiO2 35% tidal volume 43 PEEP 5  . General: Comfortable at this time . Eyes: Grossly normal lids, irises & conjunctiva . ENT: grossly tongue is normal . Neck: no obvious mass . Cardiovascular: S1-S2 normal no gallop rub . Respiratory: No rhonchi expansion . Abdomen: Soft nondistended . Skin: no rash seen on limited exam . Musculoskeletal: not rigid . Psychiatric:unable to assess . Neurologic: no seizure no involuntary movements         Labs on Admission:  Basic Metabolic Panel: Recent Labs  Lab 01/20/18 0728 01/21/18 0641 01/24/18 0527 01/25/18 1122  NA 147*  --  152*  --   K 2.8* 3.7 3.0* 3.9  CL 108  --  101  --   CO2 27  --  41*  --   GLUCOSE 173*  --  120*  --   BUN 20  --  12  --   CREATININE 0.98  --  0.94  --   CALCIUM 7.1*  --  7.5*  --     Liver Function Tests: No results for input(s): AST, ALT, ALKPHOS, BILITOT, PROT, ALBUMIN in the last 168 hours. No results for input(s): LIPASE, AMYLASE in the last 168 hours. No results for input(s): AMMONIA in the last 168 hours.  CBC: Recent Labs  Lab 01/20/18 0728 01/24/18 0527  WBC 8.7 9.4  HGB 7.2* 7.0*  HCT 24.4* 25.2*  MCV 96.1 101.2*  PLT 129* 215    Cardiac Enzymes: No results for input(s): CKTOTAL, CKMB, CKMBINDEX, TROPONINI in the last 168 hours.  BNP (last 3 results) No  results for input(s): BNP in the last 8760 hours.  ProBNP (last 3 results) No results for input(s): PROBNP in the last 8760 hours.  Radiological Exams on Admission: Ir Gastrostomy Tube Mod Sed  Result Date: 01/25/2018 INDICATION: 77 year old male with a history of dysphagia EXAM: IMAGE GUIDED GASTROSTOMY TUBE IMAGE GUIDED INJECTION OF RIGHT UPPER QUADRANT ABSCESS DRAIN. MEDICATIONS: 2.0 G ANCEF; Antibiotics were administered within 1 hour of the procedure. ANESTHESIA/SEDATION: Versed 1.0 mg IV; Fentanyl 50 mcg IV Moderate Sedation Time:  12 MINUTES The patient was continuously monitored during the procedure by the interventional radiology nurse under my direct supervision. CONTRAST:  10 CC-administered into the gastric lumen. FLUOROSCOPY TIME:  Fluoroscopy Time: 4 minutes 0 seconds (20 mGy). COMPLICATIONS: NONE PROCEDURE: Informed written consent was obtained from the patient's family after a thorough discussion of the procedural risks, benefits and alternatives. All questions were addressed. Maximal Sterile Barrier Technique was utilized including caps, mask, sterile gowns, sterile gloves, sterile drape, hand hygiene and skin antiseptic. A timeout was performed prior to the initiation of the procedure. The procedure, risks, benefits, and alternatives were explained to the patient. Questions regarding the procedure were encouraged and answered. The patient understands and consents to the procedure. The epigastrium was prepped with Betadine in a sterile fashion, and a sterile  drape was applied covering the operative field. A sterile gown and sterile gloves were used for the procedure. A 5-French orogastric tube is placed under fluoroscopic guidance. Scout imaging of the abdomen confirms barium within the transverse colon. The stomach was distended with gas. Under fluoroscopic guidance, an 18 gauge needle was utilized to puncture the anterior wall of the body of the stomach. An Amplatz wire was advanced through  the needle passing a T fastener into the lumen of the stomach. The T fastener was secured for gastropexy. A 9-French sheath was inserted. A snare was advanced through the 9-French sheath. A Teena Dunk was advanced through the orogastric tube. It was snared then pulled out the oral cavity, pulling the snare, as well. The leading edge of the gastrostomy was attached to the snare. It was then pulled down the esophagus and out the percutaneous site. It was secured in place. Contrast was injected. The right upper quadrant drain was then addressed. Scout images were acquired. Contrast was injected confirming no residual abscess. Catheter was then removed. No complication IMPRESSION: Status post percutaneous gastrostomy. Injection of drainage catheter in the right upper quadrant confirmed no residual abscess cavity. Drain was removed. Signed, Yvone Neu. Loreta Ave, DO [facility] Radiology Electronically Signed   By: Gilmer Mor D.O.   On: 01/25/2018 15:16   Ir Sinus/fist Tube Chk-non Gi  Result Date: 01/25/2018 INDICATION: 77 year old male with a history of dysphagia EXAM: IMAGE GUIDED GASTROSTOMY TUBE IMAGE GUIDED INJECTION OF RIGHT UPPER QUADRANT ABSCESS DRAIN. MEDICATIONS: 2.0 G ANCEF; Antibiotics were administered within 1 hour of the procedure. ANESTHESIA/SEDATION: Versed 1.0 mg IV; Fentanyl 50 mcg IV Moderate Sedation Time:  12 MINUTES The patient was continuously monitored during the procedure by the interventional radiology nurse under my direct supervision. CONTRAST:  10 CC-administered into the gastric lumen. FLUOROSCOPY TIME:  Fluoroscopy Time: 4 minutes 0 seconds (20 mGy). COMPLICATIONS: NONE PROCEDURE: Informed written consent was obtained from the patient's family after a thorough discussion of the procedural risks, benefits and alternatives. All questions were addressed. Maximal Sterile Barrier Technique was utilized including caps, mask, sterile gowns, sterile  gloves, sterile drape, hand hygiene and skin antiseptic. A timeout was performed prior to the initiation of the procedure. The procedure, risks, benefits, and alternatives were explained to the patient. Questions regarding the procedure were encouraged and answered. The patient understands and consents to the procedure. The epigastrium was prepped with Betadine in a sterile fashion, and a sterile drape was applied covering the operative field. A sterile gown and sterile gloves were used for the procedure. A 5-French orogastric tube is placed under fluoroscopic guidance. Scout imaging of the abdomen confirms barium within the transverse colon. The stomach was distended with gas. Under fluoroscopic guidance, an 18 gauge needle was utilized to puncture the anterior wall of the body of the stomach. An Amplatz wire was advanced through the needle passing a T fastener into the lumen of the stomach. The T fastener was secured for gastropexy. A 9-French sheath was inserted. A snare was advanced through the 9-French sheath. A Teena Dunk was advanced through the orogastric tube. It was snared then pulled out the oral cavity, pulling the snare, as well. The leading edge of the gastrostomy was attached to the snare. It was then pulled down the esophagus and out the percutaneous site. It was secured in place. Contrast was injected. The right upper quadrant drain was then addressed. Scout images were acquired. Contrast was injected confirming no residual abscess. Catheter  was then removed. No complication IMPRESSION: Status post percutaneous gastrostomy. Injection of drainage catheter in the right upper quadrant confirmed no residual abscess cavity. Drain was removed. Signed, Yvone Neu. Loreta Ave, DO [facility] Radiology Electronically Signed   By: Gilmer Mor D.O.   On: 01/25/2018 15:16    Assessment/Plan Active Problems:   Pulmonary emboli (HCC)   Acute DVT (deep venous  thrombosis) (HCC)   Postoperative intra-abdominal abscess   Acute on chronic respiratory failure with hypoxia (HCC)   Acute cholecystitis   Lobar pneumonia (HCC)   Essential hypertension   1. Acute on chronic respiratory failure with hypoxia so far is not able to wean we will reassess the RSB I try to resume weaning. 2. Postoperative intra-abdominal abscess we will continue with present management 3. Lobar pneumonia treated with antibiotics 4. Pulmonary emboli continue with supportive care   I have personally seen and evaluated the patient, evaluated laboratory and imaging results, formulated the assessment and plan and placed orders. The Patient requires high complexity decision making for assessment and support.  Case was discussed on Rounds with the Respiratory Therapy Staff  Yevonne Pax, MD [facility] Pulmonary Critical Care Medicine Sleep Medicine

## 2018-01-27 LAB — PROTIME-INR
INR: 1.18
PROTHROMBIN TIME: 15 s (ref 11.4–15.2)

## 2018-01-28 DIAGNOSIS — J181 Lobar pneumonia, unspecified organism: Secondary | ICD-10-CM | POA: Diagnosis not present

## 2018-01-28 DIAGNOSIS — I2699 Other pulmonary embolism without acute cor pulmonale: Secondary | ICD-10-CM | POA: Diagnosis not present

## 2018-01-28 DIAGNOSIS — T8149XA Infection following a procedure, other surgical site, initial encounter: Secondary | ICD-10-CM | POA: Diagnosis not present

## 2018-01-28 DIAGNOSIS — J9621 Acute and chronic respiratory failure with hypoxia: Secondary | ICD-10-CM | POA: Diagnosis not present

## 2018-01-28 LAB — CULTURE, BLOOD (ROUTINE X 2)
CULTURE: NO GROWTH
Culture: NO GROWTH
SPECIAL REQUESTS: ADEQUATE
Special Requests: ADEQUATE

## 2018-01-28 LAB — PROTIME-INR
INR: 1.38
Prothrombin Time: 16.9 seconds — ABNORMAL HIGH (ref 11.4–15.2)

## 2018-01-28 NOTE — Progress Notes (Signed)
Pulmonary Critical Care Medicine [facility] [place]   PULMONARY SERVICE  PROGRESS NOTE  Date of Service: 01/28/2018  Braun Rocca  ZOX:[id number]  DOB: Aug 08, 1941   DOA: 12/16/2017  Referring Physician: Carron Curie, MD  HPI: Candace Ramus is a 77 y.o. male seen for follow up of Acute on Chronic Respiratory Failure.  Patient seen today was on her/T collar.  We will prep to complete 5 hours.  Patient's doing a little bit better overall today  Medications: Reviewed on Rounds  Physical Exam:  Vitals: Temperature 98.9 pulse 109 respiratory rate 26 blood pressure 136/73 saturations 97%  Ventilator Settings off of the ventilator on T collar FiO2 20%  . General: Comfortable at this time . Eyes: Grossly normal lids, irises & conjunctiva . ENT: grossly tongue is normal . Neck: no obvious mass . Cardiovascular: S1-S2 normal no gallop rub . Respiratory: No rhonchi expansion is equal . Abdomen: Obese soft . Skin: no rash seen on limited exam . Musculoskeletal: not rigid . Psychiatric:unable to assess . Neurologic: no seizure no involuntary movements         Labs on Admission:  Basic Metabolic Panel: Recent Labs  Lab 01/24/18 0527 01/25/18 1122  NA 152*  --   K 3.0* 3.9  CL 101  --   CO2 41*  --   GLUCOSE 120*  --   BUN 12  --   CREATININE 0.94  --   CALCIUM 7.5*  --     Liver Function Tests: No results for input(s): AST, ALT, ALKPHOS, BILITOT, PROT, ALBUMIN in the last 168 hours. No results for input(s): LIPASE, AMYLASE in the last 168 hours. No results for input(s): AMMONIA in the last 168 hours.  CBC: Recent Labs  Lab 01/24/18 0527  WBC 9.4  HGB 7.0*  HCT 25.2*  MCV 101.2*  PLT 215    Cardiac Enzymes: No results for input(s): CKTOTAL, CKMB, CKMBINDEX, TROPONINI in the last 168 hours.  BNP (last 3 results) No results for input(s): BNP in the last 8760 hours.  ProBNP (last 3 results) No results for input(s): PROBNP in the last 8760  hours.  Radiological Exams on Admission: Ir Gastrostomy Tube Mod Sed  Result Date: 01/25/2018 INDICATION: 77 year old male with a history of dysphagia EXAM: IMAGE GUIDED GASTROSTOMY TUBE IMAGE GUIDED INJECTION OF RIGHT UPPER QUADRANT ABSCESS DRAIN. MEDICATIONS: 2.0 G ANCEF; Antibiotics were administered within 1 hour of the procedure. ANESTHESIA/SEDATION: Versed 1.0 mg IV; Fentanyl 50 mcg IV Moderate Sedation Time:  12 MINUTES The patient was continuously monitored during the procedure by the interventional radiology nurse under my direct supervision. CONTRAST:  10 CC-administered into the gastric lumen. FLUOROSCOPY TIME:  Fluoroscopy Time: 4 minutes 0 seconds (20 mGy). COMPLICATIONS: NONE PROCEDURE: Informed written consent was obtained from the patient's family after a thorough discussion of the procedural risks, benefits and alternatives. All questions were addressed. Maximal Sterile Barrier Technique was utilized including caps, mask, sterile gowns, sterile gloves, sterile drape, hand hygiene and skin antiseptic. A timeout was performed prior to the initiation of the procedure. The procedure, risks, benefits, and alternatives were explained to the patient. Questions regarding the procedure were encouraged and answered. The patient understands and consents to the procedure. The epigastrium was prepped with Betadine in a sterile fashion, and a sterile drape was applied covering the operative field. A sterile gown and sterile gloves were used for the procedure. A 5-French orogastric tube is placed under fluoroscopic guidance. Scout imaging of the abdomen confirms barium within the  transverse colon. The stomach was distended with gas. Under fluoroscopic guidance, an 18 gauge needle was utilized to puncture the anterior wall of the body of the stomach. An Amplatz wire was advanced through the needle passing a T fastener into the lumen of the stomach. The T fastener was secured for gastropexy. A 9-French sheath  was inserted. A snare was advanced through the 9-French sheath. A Teena Dunk was advanced through the orogastric tube. It was snared then pulled out the oral cavity, pulling the snare, as well. The leading edge of the gastrostomy was attached to the snare. It was then pulled down the esophagus and out the percutaneous site. It was secured in place. Contrast was injected. The right upper quadrant drain was then addressed. Scout images were acquired. Contrast was injected confirming no residual abscess. Catheter was then removed. No complication IMPRESSION: Status post percutaneous gastrostomy. Injection of drainage catheter in the right upper quadrant confirmed no residual abscess cavity. Drain was removed. Signed, Yvone Neu. Loreta Ave, DO [facility] Radiology Electronically Signed   By: Gilmer Mor D.O.   On: 01/25/2018 15:16   Ir Sinus/fist Tube Chk-non Gi  Result Date: 01/25/2018 INDICATION: 77 year old male with a history of dysphagia EXAM: IMAGE GUIDED GASTROSTOMY TUBE IMAGE GUIDED INJECTION OF RIGHT UPPER QUADRANT ABSCESS DRAIN. MEDICATIONS: 2.0 G ANCEF; Antibiotics were administered within 1 hour of the procedure. ANESTHESIA/SEDATION: Versed 1.0 mg IV; Fentanyl 50 mcg IV Moderate Sedation Time:  12 MINUTES The patient was continuously monitored during the procedure by the interventional radiology nurse under my direct supervision. CONTRAST:  10 CC-administered into the gastric lumen. FLUOROSCOPY TIME:  Fluoroscopy Time: 4 minutes 0 seconds (20 mGy). COMPLICATIONS: NONE PROCEDURE: Informed written consent was obtained from the patient's family after a thorough discussion of the procedural risks, benefits and alternatives. All questions were addressed. Maximal Sterile Barrier Technique was utilized including caps, mask, sterile gowns, sterile gloves, sterile drape, hand hygiene and skin antiseptic. A timeout was performed prior to the initiation of the procedure. The  procedure, risks, benefits, and alternatives were explained to the patient. Questions regarding the procedure were encouraged and answered. The patient understands and consents to the procedure. The epigastrium was prepped with Betadine in a sterile fashion, and a sterile drape was applied covering the operative field. A sterile gown and sterile gloves were used for the procedure. A 5-French orogastric tube is placed under fluoroscopic guidance. Scout imaging of the abdomen confirms barium within the transverse colon. The stomach was distended with gas. Under fluoroscopic guidance, an 18 gauge needle was utilized to puncture the anterior wall of the body of the stomach. An Amplatz wire was advanced through the needle passing a T fastener into the lumen of the stomach. The T fastener was secured for gastropexy. A 9-French sheath was inserted. A snare was advanced through the 9-French sheath. A Teena Dunk was advanced through the orogastric tube. It was snared then pulled out the oral cavity, pulling the snare, as well. The leading edge of the gastrostomy was attached to the snare. It was then pulled down the esophagus and out the percutaneous site. It was secured in place. Contrast was injected. The right upper quadrant drain was then addressed. Scout images were acquired. Contrast was injected confirming no residual abscess. Catheter was then removed. No complication IMPRESSION: Status post percutaneous gastrostomy. Injection of drainage catheter in the right upper quadrant confirmed no residual abscess cavity. Drain was removed. Signed, Yvone Neu. Loreta Ave, DO [facility] Radiology Electronically Signed   By: Gilmer Mor D.O.   On: 01/25/2018 15:16    Assessment/Plan Active Problems:   Pulmonary emboli (HCC)   Acute DVT (deep venous thrombosis) (HCC)   Postoperative intra-abdominal abscess   Acute on chronic respiratory failure with hypoxia (HCC)   Acute  cholecystitis   Lobar pneumonia (HCC)   Essential hypertension   1. Acute on chronic respiratory failure with hypoxia we will continue to advance the wean as tolerated.  So far patient is able to tolerate about 5 hours today we will continue him further 2. Pulmonary emboli stable at this time 3. Postoperative intra-abdominal abscess treated 4. Lobar pneumonia treated we will continue to monitor x-rays   I have personally seen and evaluated the patient, evaluated laboratory and imaging results, formulated the assessment and plan and placed orders. The Patient requires high complexity decision making for assessment and support.  Case was discussed on Rounds with the Respiratory Therapy Staff  Yevonne Pax, MD [facility] Pulmonary Critical Care Medicine Sleep Medicine

## 2018-01-29 DIAGNOSIS — J9621 Acute and chronic respiratory failure with hypoxia: Secondary | ICD-10-CM | POA: Diagnosis not present

## 2018-01-29 DIAGNOSIS — T8149XA Infection following a procedure, other surgical site, initial encounter: Secondary | ICD-10-CM | POA: Diagnosis not present

## 2018-01-29 DIAGNOSIS — J181 Lobar pneumonia, unspecified organism: Secondary | ICD-10-CM | POA: Diagnosis not present

## 2018-01-29 DIAGNOSIS — I2699 Other pulmonary embolism without acute cor pulmonale: Secondary | ICD-10-CM | POA: Diagnosis not present

## 2018-01-29 LAB — PROTIME-INR
INR: 1.73
Prothrombin Time: 20.1 seconds — ABNORMAL HIGH (ref 11.4–15.2)

## 2018-01-29 NOTE — Progress Notes (Signed)
Pulmonary Critical Care Medicine [facility] [place]   PULMONARY SERVICE  PROGRESS NOTE  Date of Service: 01/29/2018  Rodney Cordova  [id number]  DOB: Mar 30, 1941   DOA: 12/16/2017  Referring Physician: Carron Curie, MD  HPI: Rodney Cordova is a 77 y.o. male seen for follow up of Acute on Chronic Respiratory Failure.  Patient remains on pressure support mode currently on 35% oxygen.  Has not been tolerating T collar very well today did well yesterday we will have to have respiratory continue to assess and make an attempt  Medications: Reviewed on Rounds  Physical Exam:  Vitals: Temperature 96.0 pulse 106 respiratory rate 30 blood pressure 10/19/1962 saturations 96%  Ventilator Settings mode of ventilation pressure support FiO2 35% tidal volume 543 pressure support 12/5  . General: Comfortable at this time . Eyes: Grossly normal lids, irises & conjunctiva . ENT: grossly tongue is normal . Neck: no obvious mass . Cardiovascular: S1-S2 normal no gallop or rub . Respiratory: No rhonchi . Abdomen: Soft and nontender . Skin: no rash seen on limited exam . Musculoskeletal: not rigid . Psychiatric:unable to assess . Neurologic: no seizure no involuntary movements         Labs on Admission:  Basic Metabolic Panel: Recent Labs  Lab 01/24/18 0527 01/25/18 1122  NA 152*  --   K 3.0* 3.9  CL 101  --   CO2 41*  --   GLUCOSE 120*  --   BUN 12  --   CREATININE 0.94  --   CALCIUM 7.5*  --     Liver Function Tests: No results for input(s): AST, ALT, ALKPHOS, BILITOT, PROT, ALBUMIN in the last 168 hours. No results for input(s): LIPASE, AMYLASE in the last 168 hours. No results for input(s): AMMONIA in the last 168 hours.  CBC: Recent Labs  Lab 01/24/18 0527  WBC 9.4  HGB 7.0*  HCT 25.2*  MCV 101.2*  PLT 215    Cardiac Enzymes: No results for input(s): CKTOTAL, CKMB, CKMBINDEX, TROPONINI in the last 168 hours.  BNP (last 3 results) No results for  input(s): BNP in the last 8760 hours.  ProBNP (last 3 results) No results for input(s): PROBNP in the last 8760 hours.  Radiological Exams on Admission: Ir Gastrostomy Tube Mod Sed  Result Date: 01/25/2018 INDICATION: 77 year old male with a history of dysphagia EXAM: IMAGE GUIDED GASTROSTOMY TUBE IMAGE GUIDED INJECTION OF RIGHT UPPER QUADRANT ABSCESS DRAIN. MEDICATIONS: 2.0 G ANCEF; Antibiotics were administered within 1 hour of the procedure. ANESTHESIA/SEDATION: Versed 1.0 mg IV; Fentanyl 50 mcg IV Moderate Sedation Time:  12 MINUTES The patient was continuously monitored during the procedure by the interventional radiology nurse under my direct supervision. CONTRAST:  10 CC-administered into the gastric lumen. FLUOROSCOPY TIME:  Fluoroscopy Time: 4 minutes 0 seconds (20 mGy). COMPLICATIONS: NONE PROCEDURE: Informed written consent was obtained from the patient's family after a thorough discussion of the procedural risks, benefits and alternatives. All questions were addressed. Maximal Sterile Barrier Technique was utilized including caps, mask, sterile gowns, sterile gloves, sterile drape, hand hygiene and skin antiseptic. A timeout was performed prior to the initiation of the procedure. The procedure, risks, benefits, and alternatives were explained to the patient. Questions regarding the procedure were encouraged and answered. The patient understands and consents to the procedure. The epigastrium was prepped with Betadine in a sterile fashion, and a sterile drape was applied covering the operative field. A sterile gown and sterile gloves were used for the procedure. A 5-French orogastric  tube is placed under fluoroscopic guidance. Scout imaging of the abdomen confirms barium within the transverse colon. The stomach was distended with gas. Under fluoroscopic guidance, an 18 gauge needle was utilized to puncture the anterior wall of the body of the stomach. An Amplatz wire was advanced through the needle  passing a T fastener into the lumen of the stomach. The T fastener was secured for gastropexy. A 9-French sheath was inserted. A snare was advanced through the 9-French sheath. A Teena Dunk was advanced through the orogastric tube. It was snared then pulled out the oral cavity, pulling the snare, as well. The leading edge of the gastrostomy was attached to the snare. It was then pulled down the esophagus and out the percutaneous site. It was secured in place. Contrast was injected. The right upper quadrant drain was then addressed. Scout images were acquired. Contrast was injected confirming no residual abscess. Catheter was then removed. No complication IMPRESSION: Status post percutaneous gastrostomy. Injection of drainage catheter in the right upper quadrant confirmed no residual abscess cavity. Drain was removed. Signed, Yvone Neu. Loreta Ave, DO [facility] Radiology Electronically Signed   By: Gilmer Mor D.O.   On: 01/25/2018 15:16   Ir Sinus/fist Tube Chk-non Gi  Result Date: 01/25/2018 INDICATION: 77 year old male with a history of dysphagia EXAM: IMAGE GUIDED GASTROSTOMY TUBE IMAGE GUIDED INJECTION OF RIGHT UPPER QUADRANT ABSCESS DRAIN. MEDICATIONS: 2.0 G ANCEF; Antibiotics were administered within 1 hour of the procedure. ANESTHESIA/SEDATION: Versed 1.0 mg IV; Fentanyl 50 mcg IV Moderate Sedation Time:  12 MINUTES The patient was continuously monitored during the procedure by the interventional radiology nurse under my direct supervision. CONTRAST:  10 CC-administered into the gastric lumen. FLUOROSCOPY TIME:  Fluoroscopy Time: 4 minutes 0 seconds (20 mGy). COMPLICATIONS: NONE PROCEDURE: Informed written consent was obtained from the patient's family after a thorough discussion of the procedural risks, benefits and alternatives. All questions were addressed. Maximal Sterile Barrier Technique was utilized including caps, mask, sterile gowns, sterile gloves,  sterile drape, hand hygiene and skin antiseptic. A timeout was performed prior to the initiation of the procedure. The procedure, risks, benefits, and alternatives were explained to the patient. Questions regarding the procedure were encouraged and answered. The patient understands and consents to the procedure. The epigastrium was prepped with Betadine in a sterile fashion, and a sterile drape was applied covering the operative field. A sterile gown and sterile gloves were used for the procedure. A 5-French orogastric tube is placed under fluoroscopic guidance. Scout imaging of the abdomen confirms barium within the transverse colon. The stomach was distended with gas. Under fluoroscopic guidance, an 18 gauge needle was utilized to puncture the anterior wall of the body of the stomach. An Amplatz wire was advanced through the needle passing a T fastener into the lumen of the stomach. The T fastener was secured for gastropexy. A 9-French sheath was inserted. A snare was advanced through the 9-French sheath. A Teena Dunk was advanced through the orogastric tube. It was snared then pulled out the oral cavity, pulling the snare, as well. The leading edge of the gastrostomy was attached to the snare. It was then pulled down the esophagus and out the percutaneous site. It was secured in place. Contrast was injected. The right upper quadrant drain was then addressed. Scout images were acquired. Contrast was injected confirming no residual abscess. Catheter was then removed. No complication IMPRESSION: Status post percutaneous gastrostomy. Injection of drainage catheter in the right upper quadrant confirmed no  residual abscess cavity. Drain was removed. Signed, Yvone Neu. Loreta Ave, DO [facility] Radiology Electronically Signed   By: Gilmer Mor D.O.   On: 01/25/2018 15:16    Assessment/Plan Active Problems:   Pulmonary emboli (HCC)   Acute DVT (deep venous thrombosis)  (HCC)   Postoperative intra-abdominal abscess   Acute on chronic respiratory failure with hypoxia (HCC)   Acute cholecystitis   Lobar pneumonia (HCC)   Essential hypertension   1. Acute on chronic respiratory failure with hypoxia currently not doing very well with the weaning attempts respiratory will reassess and resume weaning if patient is able to tolerate 2. Pulmonary emboli at baseline we will continue to monitor 3. Lobar pneumonia treated with antibiotics we will continue supportive care 4. Postoperative intra-abdominal abscess treated   I have personally seen and evaluated the patient, evaluated laboratory and imaging results, formulated the assessment and plan and placed orders. The Patient requires high complexity decision making for assessment and support.  Case was discussed on Rounds with the Respiratory Therapy Staff  Yevonne Pax, MD [facility] Pulmonary Critical Care Medicine Sleep Medicine

## 2018-01-30 DIAGNOSIS — I2699 Other pulmonary embolism without acute cor pulmonale: Secondary | ICD-10-CM | POA: Diagnosis not present

## 2018-01-30 DIAGNOSIS — J181 Lobar pneumonia, unspecified organism: Secondary | ICD-10-CM | POA: Diagnosis not present

## 2018-01-30 DIAGNOSIS — T8149XA Infection following a procedure, other surgical site, initial encounter: Secondary | ICD-10-CM | POA: Diagnosis not present

## 2018-01-30 DIAGNOSIS — J9621 Acute and chronic respiratory failure with hypoxia: Secondary | ICD-10-CM | POA: Diagnosis not present

## 2018-01-30 LAB — PROTIME-INR
INR: 2.15
PROTHROMBIN TIME: 23.8 s — AB (ref 11.4–15.2)

## 2018-01-30 NOTE — Progress Notes (Signed)
Pulmonary Critical Care Medicine [facility] [place]   PULMONARY SERVICE  PROGRESS NOTE  Date of Service: 01/30/2018  Rodney Cordova  WGN:[id number]  DOB: [birth year]   DOA: 12/16/2017  Referring Physician: Carron Curie, MD  HPI: Rodney Cordova is a 77 y.o. male seen for follow up of Acute on Chronic Respiratory Failure.  Patient is on T collar currently on 35% oxygen tolerating it well.  Right now good saturations to goal is for about 12 hours  Medications: Reviewed on Rounds  Physical Exam:  Vitals: Temperature 97.2 pulse 95 respiratory 25 blood pressure 130/68 saturations 98%  Ventilator Settings off of the ventilator on T collar trials  . General: Comfortable at this time . Eyes: Grossly normal lids, irises & conjunctiva . ENT: grossly tongue is normal . Neck: no obvious mass . Cardiovascular: S1-S2 normal no gallop no rub . Respiratory: No rhonchi expansion is equal . Abdomen: Soft nontender . Skin: no rash seen on limited exam . Musculoskeletal: not rigid . Psychiatric:unable to assess . Neurologic: no seizure no involuntary movements         Labs on Admission:  Basic Metabolic Panel: Recent Labs  Lab 01/24/18 0527 01/25/18 1122  NA 152*  --   K 3.0* 3.9  CL 101  --   CO2 41*  --   GLUCOSE 120*  --   BUN 12  --   CREATININE 0.94  --   CALCIUM 7.5*  --     Liver Function Tests: No results for input(s): AST, ALT, ALKPHOS, BILITOT, PROT, ALBUMIN in the last 168 hours. No results for input(s): LIPASE, AMYLASE in the last 168 hours. No results for input(s): AMMONIA in the last 168 hours.  CBC: Recent Labs  Lab 01/24/18 0527  WBC 9.4  HGB 7.0*  HCT 25.2*  MCV 101.2*  PLT 215    Cardiac Enzymes: No results for input(s): CKTOTAL, CKMB, CKMBINDEX, TROPONINI in the last 168 hours.  BNP (last 3 results) No results for input(s): BNP in the last 8760 hours.  ProBNP (last 3 results) No results for input(s): PROBNP in the last 8760  hours.  Radiological Exams on Admission: No results found.  Assessment/Plan Active Problems:   Pulmonary emboli (HCC)   Acute DVT (deep venous thrombosis) (HCC)   Postoperative intra-abdominal abscess   Acute on chronic respiratory failure with hypoxia (HCC)   Acute cholecystitis   Lobar pneumonia (HCC)   Essential hypertension   1. Acute on chronic respiratory failure with hypoxia continue with weaning on T collar the goals 12 hours patient was able to complete 8 hours yesterday 2. Postoperative intra-abdominal abscess stable we will continue to monitor 3. Pulmonary emboli stable treated we will continue to follow 4. Lobar pneumonia treated with antibiotics follow-up x-rays as necessary   I have personally seen and evaluated the patient, evaluated laboratory and imaging results, formulated the assessment and plan and placed orders. The Patient requires high complexity decision making for assessment and support.  Case was discussed on Rounds with the Respiratory Therapy Staff  Yevonne Pax, MD [facility] Pulmonary Critical Care Medicine Sleep Medicine

## 2018-01-31 DIAGNOSIS — J181 Lobar pneumonia, unspecified organism: Secondary | ICD-10-CM | POA: Diagnosis not present

## 2018-01-31 DIAGNOSIS — J9621 Acute and chronic respiratory failure with hypoxia: Secondary | ICD-10-CM | POA: Diagnosis not present

## 2018-01-31 DIAGNOSIS — I2699 Other pulmonary embolism without acute cor pulmonale: Secondary | ICD-10-CM | POA: Diagnosis not present

## 2018-01-31 LAB — PROTIME-INR
INR: 2.94
Prothrombin Time: 30.5 seconds — ABNORMAL HIGH (ref 11.4–15.2)

## 2018-01-31 NOTE — Progress Notes (Signed)
Pulmonary Critical Care Medicine [facility] [place]   PULMONARY SERVICE  PROGRESS NOTE  Date of Service: 01/31/2018  Rodney Cordova  ZOX:[id number]  DOB: 1941-03-03   DOA: 12/16/2017  Referring Physician: Carron Curie, MD  HPI: Rodney Cordova is a 77 y.o. male seen for follow up of Acute on Chronic Respiratory Failure.  Comfortable without distress was able to do about 12 hours on T collar yesterday today is on goal for 16 hours has PMV on his tolerating it fairly well  Medications: Reviewed on Rounds  Physical Exam:  Vitals: Temperature 97.6 pulse 114 respiratory rate 19 blood pressure 130/67 saturations 94%  Ventilator Settings off of the ventilator on T collar trials  . General: Comfortable at this time . Eyes: Grossly normal lids, irises & conjunctiva . ENT: grossly tongue is normal . Neck: no obvious mass . Cardiovascular: S1-S2 normal no gallop or rub . Respiratory: No rhonchi expansion equal . Abdomen: Soft nontender . Skin: no rash seen on limited exam . Musculoskeletal: not rigid . Psychiatric:unable to assess . Neurologic: no seizure no involuntary movements         Labs on Admission:  Basic Metabolic Panel: Recent Labs  Lab 01/25/18 1122  K 3.9    Liver Function Tests: No results for input(s): AST, ALT, ALKPHOS, BILITOT, PROT, ALBUMIN in the last 168 hours. No results for input(s): LIPASE, AMYLASE in the last 168 hours. No results for input(s): AMMONIA in the last 168 hours.  CBC: No results for input(s): WBC, NEUTROABS, HGB, HCT, MCV, PLT in the last 168 hours.  Cardiac Enzymes: No results for input(s): CKTOTAL, CKMB, CKMBINDEX, TROPONINI in the last 168 hours.  BNP (last 3 results) No results for input(s): BNP in the last 8760 hours.  ProBNP (last 3 results) No results for input(s): PROBNP in the last 8760 hours.  Radiological Exams on Admission: No results found.  Assessment/Plan Active Problems:   Pulmonary emboli (HCC)   Acute  DVT (deep venous thrombosis) (HCC)   Postoperative intra-abdominal abscess   Acute on chronic respiratory failure with hypoxia (HCC)   Acute cholecystitis   Lobar pneumonia (HCC)   Essential hypertension   1. Acute on chronic respiratory failure with hypoxia we will continue to wean on T collar trials as mentioned above the goal is for 16 hours today. 2. Lobar pneumonia treated with antibiotics we will continue to follow 3. Postoperative intra-abdominal abscess treated we will continue to monitor 4. Pulmonary emboli treated with continue to follow along   I have personally seen and evaluated the patient, evaluated laboratory and imaging results, formulated the assessment and plan and placed orders. The Patient requires high complexity decision making for assessment and support.  Case was discussed on Rounds with the Respiratory Therapy Staff  Yevonne Pax, MD [facility] Pulmonary Critical Care Medicine Sleep Medicine

## 2018-02-01 DIAGNOSIS — J181 Lobar pneumonia, unspecified organism: Secondary | ICD-10-CM | POA: Diagnosis not present

## 2018-02-01 DIAGNOSIS — T8149XA Infection following a procedure, other surgical site, initial encounter: Secondary | ICD-10-CM | POA: Diagnosis not present

## 2018-02-01 DIAGNOSIS — J9621 Acute and chronic respiratory failure with hypoxia: Secondary | ICD-10-CM | POA: Diagnosis not present

## 2018-02-01 DIAGNOSIS — I2699 Other pulmonary embolism without acute cor pulmonale: Secondary | ICD-10-CM | POA: Diagnosis not present

## 2018-02-01 LAB — CBC
HCT: 27.3 % — ABNORMAL LOW (ref 39.0–52.0)
HEMOGLOBIN: 8.1 g/dL — AB (ref 13.0–17.0)
MCH: 29.9 pg (ref 26.0–34.0)
MCHC: 29.7 g/dL — ABNORMAL LOW (ref 30.0–36.0)
MCV: 100.7 fL — AB (ref 78.0–100.0)
PLATELETS: 332 10*3/uL (ref 150–400)
RBC: 2.71 MIL/uL — AB (ref 4.22–5.81)
RDW: 19.5 % — ABNORMAL HIGH (ref 11.5–15.5)
WBC: 5.4 10*3/uL (ref 4.0–10.5)

## 2018-02-01 LAB — BASIC METABOLIC PANEL
ANION GAP: 9 (ref 5–15)
BUN: 18 mg/dL (ref 6–20)
CALCIUM: 8.5 mg/dL — AB (ref 8.9–10.3)
CHLORIDE: 100 mmol/L — AB (ref 101–111)
CO2: 35 mmol/L — AB (ref 22–32)
Creatinine, Ser: 0.79 mg/dL (ref 0.61–1.24)
GFR calc non Af Amer: >60 mL/min (ref >60)
Glucose, Bld: 103 mg/dL — ABNORMAL HIGH (ref 65–99)
Potassium: 4.1 mmol/L (ref 3.5–5.1)
SODIUM: 144 mmol/L (ref 135–145)

## 2018-02-01 LAB — PROTIME-INR
INR: 2.57
Prothrombin Time: 27.4 seconds — ABNORMAL HIGH (ref 11.4–15.2)

## 2018-02-01 NOTE — Progress Notes (Signed)
Pulmonary Critical Care Medicine [facility] [place]   PULMONARY SERVICE  PROGRESS NOTE  Date of Service: 02/01/2018  Odus Clasby  ZOX:[id number]  DOB: 05-18-1941   DOA: 12/16/2017  Referring Physician: Carron Curie, MD  HPI: Rodney Cordova is a 77 y.o. male seen for follow up of Acute on Chronic Respiratory Failure.  Patient is on T collar has been on 40% oxygen the goal is for about 20 hours  Medications: Reviewed on Rounds  Physical Exam:  Vitals: Temperature 97.1 pulse 95 respiratory 28 blood pressure 146/65 saturations 98%  Ventilator Settings on T collar  . General: Comfortable at this time . Eyes: Grossly normal lids, irises & conjunctiva . ENT: grossly tongue is normal . Neck: no obvious mass . Cardiovascular: S1-S2 normal no gallop or rub . Respiratory: No rhonchi at this time . Abdomen: Soft and nontender . Skin: no rash seen on limited exam . Musculoskeletal: not rigid . Psychiatric:unable to assess . Neurologic: no seizure no involuntary movements         Labs on Admission:  Basic Metabolic Panel: Recent Labs  Lab 02/01/18 0704  NA 144  K 4.1  CL 100*  CO2 35*  GLUCOSE 103*  BUN 18  CREATININE 0.79  CALCIUM 8.5*    Liver Function Tests: No results for input(s): AST, ALT, ALKPHOS, BILITOT, PROT, ALBUMIN in the last 168 hours. No results for input(s): LIPASE, AMYLASE in the last 168 hours. No results for input(s): AMMONIA in the last 168 hours.  CBC: Recent Labs  Lab 02/01/18 0704  WBC 5.4  HGB 8.1*  HCT 27.3*  MCV 100.7*  PLT 332    Cardiac Enzymes: No results for input(s): CKTOTAL, CKMB, CKMBINDEX, TROPONINI in the last 168 hours.  BNP (last 3 results) No results for input(s): BNP in the last 8760 hours.  ProBNP (last 3 results) No results for input(s): PROBNP in the last 8760 hours.  Radiological Exams on Admission: No results found.  Assessment/Plan Active Problems:   Pulmonary emboli (HCC)   Acute DVT (deep  venous thrombosis) (HCC)   Postoperative intra-abdominal abscess   Acute on chronic respiratory failure with hypoxia (HCC)   Acute cholecystitis   Lobar pneumonia (HCC)   Essential hypertension   1. Acute on chronic respiratory failure with hypoxia we will continue weaning on her/T collar tolerating it well so far secretions are fair to moderate patient's oxygen requirements are still high. 2. Postoperative intra-abdominal abscess treated we will continue to monitor 3. Lobar pneumonia treated with antibiotics 4. Pulmonary emboli stable we will continue to monitor    I have personally seen and evaluated the patient, evaluated laboratory and imaging results, formulated the assessment and plan and placed orders. The Patient requires high complexity decision making for assessment and support.  Case was discussed on Rounds with the Respiratory Therapy Staff  Yevonne Pax, MD [facility] Pulmonary Critical Care Medicine Sleep Medicine

## 2018-02-02 DIAGNOSIS — J181 Lobar pneumonia, unspecified organism: Secondary | ICD-10-CM | POA: Diagnosis not present

## 2018-02-02 DIAGNOSIS — J9621 Acute and chronic respiratory failure with hypoxia: Secondary | ICD-10-CM | POA: Diagnosis not present

## 2018-02-02 DIAGNOSIS — T8149XA Infection following a procedure, other surgical site, initial encounter: Secondary | ICD-10-CM | POA: Diagnosis not present

## 2018-02-02 DIAGNOSIS — I2699 Other pulmonary embolism without acute cor pulmonale: Secondary | ICD-10-CM | POA: Diagnosis not present

## 2018-02-02 LAB — PROTIME-INR
INR: 2.43
Prothrombin Time: 26.2 seconds — ABNORMAL HIGH (ref 11.4–15.2)

## 2018-02-02 NOTE — Progress Notes (Signed)
Pulmonary Critical Care Medicine [facility] [place]   PULMONARY SERVICE  PROGRESS NOTE  Date of Service: 02/02/2018  Rodney Cordova  ZOX:[id number]  DOB: 20-Nov-1940   DOA: 12/16/2017  Referring Physician: Carron Curie, MD  HPI: Rodney Cordova is a 77 y.o. male seen for follow up of Acute on Chronic Respiratory Failure.  Currently patient is on T collar trials has been on 28% the goal is for 24 hours  Medications: Reviewed on Rounds  Physical Exam:  Vitals: Temperature 97.2 pulse 91 respiratory rate 16 blood pressure 131/77 saturations 93%  Ventilator Settings on T collar trials  . General: Comfortable at this time . Eyes: Grossly normal lids, irises & conjunctiva . ENT: grossly tongue is normal . Neck: no obvious mass . Cardiovascular: S1-S2 normal no gallop rub . Respiratory: No rhonchi expansion is equal . Abdomen: Soft nontender . Skin: no rash seen on limited exam . Musculoskeletal: not rigid . Psychiatric:unable to assess . Neurologic: no seizure no involuntary movements         Labs on Admission:  Basic Metabolic Panel: Recent Labs  Lab 02/01/18 0704  NA 144  K 4.1  CL 100*  CO2 35*  GLUCOSE 103*  BUN 18  CREATININE 0.79  CALCIUM 8.5*    Liver Function Tests: No results for input(s): AST, ALT, ALKPHOS, BILITOT, PROT, ALBUMIN in the last 168 hours. No results for input(s): LIPASE, AMYLASE in the last 168 hours. No results for input(s): AMMONIA in the last 168 hours.  CBC: Recent Labs  Lab 02/01/18 0704  WBC 5.4  HGB 8.1*  HCT 27.3*  MCV 100.7*  PLT 332    Cardiac Enzymes: No results for input(s): CKTOTAL, CKMB, CKMBINDEX, TROPONINI in the last 168 hours.  BNP (last 3 results) No results for input(s): BNP in the last 8760 hours.  ProBNP (last 3 results) No results for input(s): PROBNP in the last 8760 hours.  Radiological Exams on Admission: No results found.  Assessment/Plan Active Problems:   Pulmonary emboli (HCC)    Acute DVT (deep venous thrombosis) (HCC)   Postoperative intra-abdominal abscess   Acute on chronic respiratory failure with hypoxia (HCC)   Acute cholecystitis   Lobar pneumonia (HCC)   Essential hypertension   1. Acute on chronic respiratory failure with hypoxia so far doing well we will continue to advance the weaning on T collar trials.  Titrate oxygen as tolerated 2. Pulmonary emboli at baseline we will continue with supportive care 3. Lobar pneumonia treated with antibiotics clinically doing better 4. Postoperative intra-abdominal abscess treated   I have personally seen and evaluated the patient, evaluated laboratory and imaging results, formulated the assessment and plan and placed orders. The Patient requires high complexity decision making for assessment and support.  Case was discussed on Rounds with the Respiratory Therapy Staff  Yevonne Pax, MD [facility] Pulmonary Critical Care Medicine Sleep Medicine

## 2018-02-03 DIAGNOSIS — J181 Lobar pneumonia, unspecified organism: Secondary | ICD-10-CM | POA: Diagnosis not present

## 2018-02-03 DIAGNOSIS — J9621 Acute and chronic respiratory failure with hypoxia: Secondary | ICD-10-CM | POA: Diagnosis not present

## 2018-02-03 DIAGNOSIS — I2699 Other pulmonary embolism without acute cor pulmonale: Secondary | ICD-10-CM | POA: Diagnosis not present

## 2018-02-03 DIAGNOSIS — T8149XA Infection following a procedure, other surgical site, initial encounter: Secondary | ICD-10-CM | POA: Diagnosis not present

## 2018-02-03 LAB — PROTIME-INR
INR: 3.07
PROTHROMBIN TIME: 31.5 s — AB (ref 11.4–15.2)

## 2018-02-03 NOTE — Progress Notes (Signed)
Pulmonary Critical Care Medicine [facility] [place]   PULMONARY SERVICE  PROGRESS NOTE  Date of Service: 02/03/2018  Indalecio Malmstrom  ZOX:[id number]  DOB: [birth year]   DOA: 12/16/2017  Referring Physician: Carron Curie, MD  HPI: Clayburn Weekly is a 77 y.o. male seen for follow up of Acute on Chronic Respiratory Failure.  Patient right now is on T collar has been on 35% oxygen is able to do about 24 hours on T collar  Medications: Reviewed on Rounds  Physical Exam:  Vitals: Temperature 97.1 pulse 91 respiratory rate 20 blood pressure 108/64 saturations 95%  Ventilator Settings patient is on T collar trials will continue to advance as tolerated  . General: Comfortable at this time . Eyes: Grossly normal lids, irises & conjunctiva . ENT: grossly tongue is normal . Neck: no obvious mass . Cardiovascular: S1-S2 normal no gallop or rub . Respiratory: No rhonchi expansion is equal . Abdomen: Soft nontender . Skin: no rash seen on limited exam . Musculoskeletal: not rigid . Psychiatric:unable to assess . Neurologic: no seizure no involuntary movements         Labs on Admission:  Basic Metabolic Panel: Recent Labs  Lab 02/01/18 0704  NA 144  K 4.1  CL 100*  CO2 35*  GLUCOSE 103*  BUN 18  CREATININE 0.79  CALCIUM 8.5*    Liver Function Tests: No results for input(s): AST, ALT, ALKPHOS, BILITOT, PROT, ALBUMIN in the last 168 hours. No results for input(s): LIPASE, AMYLASE in the last 168 hours. No results for input(s): AMMONIA in the last 168 hours.  CBC: Recent Labs  Lab 02/01/18 0704  WBC 5.4  HGB 8.1*  HCT 27.3*  MCV 100.7*  PLT 332    Cardiac Enzymes: No results for input(s): CKTOTAL, CKMB, CKMBINDEX, TROPONINI in the last 168 hours.  BNP (last 3 results) No results for input(s): BNP in the last 8760 hours.  ProBNP (last 3 results) No results for input(s): PROBNP in the last 8760 hours.  Radiological Exams on Admission: No results  found.  Assessment/Plan Active Problems:   Pulmonary emboli (HCC)   Acute DVT (deep venous thrombosis) (HCC)   Postoperative intra-abdominal abscess   Acute on chronic respiratory failure with hypoxia (HCC)   Acute cholecystitis   Lobar pneumonia (HCC)   Essential hypertension   1. Acute on chronic respiratory failure with hypoxia continue with T collar patient has been able to go beyond 24 hours as indicated.  Can try to wean the FiO2 down slowly also. 2. Postoperative intra-abdominal abscess clinically improving we will continue to follow 3. Lobar pneumonia treated with antibiotics we will continue to monitor 4. Pulmonary emboli treated   I have personally seen and evaluated the patient, evaluated laboratory and imaging results, formulated the assessment and plan and placed orders. The Patient requires high complexity decision making for assessment and support.  Case was discussed on Rounds with the Respiratory Therapy Staff  Yevonne Pax, MD [facility] Pulmonary Critical Care Medicine Sleep Medicine

## 2018-02-04 DIAGNOSIS — T8149XA Infection following a procedure, other surgical site, initial encounter: Secondary | ICD-10-CM | POA: Diagnosis not present

## 2018-02-04 DIAGNOSIS — J181 Lobar pneumonia, unspecified organism: Secondary | ICD-10-CM | POA: Diagnosis not present

## 2018-02-04 DIAGNOSIS — I2699 Other pulmonary embolism without acute cor pulmonale: Secondary | ICD-10-CM | POA: Diagnosis not present

## 2018-02-04 DIAGNOSIS — J9621 Acute and chronic respiratory failure with hypoxia: Secondary | ICD-10-CM | POA: Diagnosis not present

## 2018-02-04 LAB — PROTIME-INR
INR: 2.63
Prothrombin Time: 27.9 seconds — ABNORMAL HIGH (ref 11.4–15.2)

## 2018-02-04 NOTE — Progress Notes (Signed)
Pulmonary Critical Care Medicine [facility] [place]   PULMONARY SERVICE  PROGRESS NOTE  Date of Service: 02/04/2018  Rodney Cordova  ZOX:[id number]  DOB: [birth year]   DOA: 12/16/2017  Referring Physician: Carron Curie, MD  HPI: Rodney Cordova is a 77 y.o. male seen for follow up of Acute on Chronic Respiratory Failure.  He is doing very well has been weaning on T collar now is beyond 48 hours.  Looks good comfortable no distress at this time  Medications: Reviewed on Rounds  Physical Exam:  Vitals: Temperature 98.3 pulse 87 respiratory rate 25 blood pressure 131/73 saturations 94%  Ventilator Settings currently off of the ventilator on T collar  . General: Comfortable at this time . Eyes: Grossly normal lids, irises & conjunctiva . ENT: grossly tongue is normal . Neck: no obvious mass . Cardiovascular: S1-S2 normal no gallop or rub . Respiratory: No rhonchi expansion is equal . Abdomen: Soft and nontender . Skin: no rash seen on limited exam . Musculoskeletal: not rigid . Psychiatric:unable to assess . Neurologic: no seizure no involuntary movements         Labs on Admission:  Basic Metabolic Panel: Recent Labs  Lab 02/01/18 0704  NA 144  K 4.1  CL 100*  CO2 35*  GLUCOSE 103*  BUN 18  CREATININE 0.79  CALCIUM 8.5*    Liver Function Tests: No results for input(s): AST, ALT, ALKPHOS, BILITOT, PROT, ALBUMIN in the last 168 hours. No results for input(s): LIPASE, AMYLASE in the last 168 hours. No results for input(s): AMMONIA in the last 168 hours.  CBC: Recent Labs  Lab 02/01/18 0704  WBC 5.4  HGB 8.1*  HCT 27.3*  MCV 100.7*  PLT 332    Cardiac Enzymes: No results for input(s): CKTOTAL, CKMB, CKMBINDEX, TROPONINI in the last 168 hours.  BNP (last 3 results) No results for input(s): BNP in the last 8760 hours.  ProBNP (last 3 results) No results for input(s): PROBNP in the last 8760 hours.  Radiological Exams on Admission: No results  found.  Assessment/Plan Active Problems:   Pulmonary emboli (HCC)   Acute DVT (deep venous thrombosis) (HCC)   Postoperative intra-abdominal abscess   Acute on chronic respiratory failure with hypoxia (HCC)   Acute cholecystitis   Lobar pneumonia (HCC)   Essential hypertension   1. Acute on chronic respiratory failure with hypoxia we will continue with weaning on T collar trials as ordered.  Tomorrow hopefully we can start with PMV and capping evaluation 2. Postoperative intra-abdominal abscess clinically resolved 3. Lobar pneumonia treated with antibiotics we will follow 4. Pulmonary embolism treated   I have personally seen and evaluated the patient, evaluated laboratory and imaging results, formulated the assessment and plan and placed orders. The Patient requires high complexity decision making for assessment and support.  Case was discussed on Rounds with the Respiratory Therapy Staff  Yevonne Pax, MD [facility] Pulmonary Critical Care Medicine Sleep Medicine

## 2018-02-05 DIAGNOSIS — J181 Lobar pneumonia, unspecified organism: Secondary | ICD-10-CM | POA: Diagnosis not present

## 2018-02-05 DIAGNOSIS — I2699 Other pulmonary embolism without acute cor pulmonale: Secondary | ICD-10-CM | POA: Diagnosis not present

## 2018-02-05 DIAGNOSIS — T8149XA Infection following a procedure, other surgical site, initial encounter: Secondary | ICD-10-CM | POA: Diagnosis not present

## 2018-02-05 DIAGNOSIS — J9621 Acute and chronic respiratory failure with hypoxia: Secondary | ICD-10-CM | POA: Diagnosis not present

## 2018-02-05 LAB — PROTIME-INR
INR: 2.08
Prothrombin Time: 23.2 seconds — ABNORMAL HIGH (ref 11.4–15.2)

## 2018-02-05 NOTE — Progress Notes (Signed)
Pulmonary Critical Care Medicine [facility] [place]   PULMONARY SERVICE  PROGRESS NOTE  Date of Service: 02/05/2018  Rodney Cordova  ZOX:[id number]  DOB: 06/05/1941   DOA: 12/16/2017  Referring Physician: Carron Curie, MD  HPI: Rodney Cordova is a 77 y.o. male seen for follow up of Acute on Chronic Respiratory Failure.  Patient is doing fairly well on T collar trials has been on 35% oxygen no distress noted at this time.  Medications: Reviewed on Rounds  Physical Exam:  Vitals: Temperature 98.8 pulse 88 respiratory rate 18 blood pressure 154/66 saturations 100%  Ventilator Settings aerosolized T collar FiO2 35%  . General: Comfortable at this time . Eyes: Grossly normal lids, irises & conjunctiva . ENT: grossly tongue is normal . Neck: no obvious mass . Cardiovascular: S1-S2 normal . Respiratory: Scattered rhonchi expansion equal . Abdomen: Soft nontender . Skin: no rash seen on limited exam . Musculoskeletal: not rigid . Psychiatric:unable to assess . Neurologic: no seizure no involuntary movements         Labs on Admission:  Basic Metabolic Panel: Recent Labs  Lab 02/01/18 0704  NA 144  K 4.1  CL 100*  CO2 35*  GLUCOSE 103*  BUN 18  CREATININE 0.79  CALCIUM 8.5*    Liver Function Tests: No results for input(s): AST, ALT, ALKPHOS, BILITOT, PROT, ALBUMIN in the last 168 hours. No results for input(s): LIPASE, AMYLASE in the last 168 hours. No results for input(s): AMMONIA in the last 168 hours.  CBC: Recent Labs  Lab 02/01/18 0704  WBC 5.4  HGB 8.1*  HCT 27.3*  MCV 100.7*  PLT 332    Cardiac Enzymes: No results for input(s): CKTOTAL, CKMB, CKMBINDEX, TROPONINI in the last 168 hours.  BNP (last 3 results) No results for input(s): BNP in the last 8760 hours.  ProBNP (last 3 results) No results for input(s): PROBNP in the last 8760 hours.  Radiological Exams on Admission: No results found.  Assessment/Plan Active Problems:    Pulmonary emboli (HCC)   Acute DVT (deep venous thrombosis) (HCC)   Postoperative intra-abdominal abscess   Acute on chronic respiratory failure with hypoxia (HCC)   Acute cholecystitis   Lobar pneumonia (HCC)   Essential hypertension   1. Acute on chronic respiratory failure with hypoxia patient is doing very well with weaning we will go ahead and change his trach out today please cuffless trach in now and try to start capping trials.  Continue pulmonary toilet supportive care 2. Lobar pneumonia clinically improving 3. Postoperative intra-abdominal abscess treated we will continue to follow 4. Pulmonary embolism at baseline   I have personally seen and evaluated the patient, evaluated laboratory and imaging results, formulated the assessment and plan and placed orders. The Patient requires high complexity decision making for assessment and support.  Case was discussed on Rounds with the Respiratory Therapy Staff  Yevonne Pax, MD [facility] Pulmonary Critical Care Medicine Sleep Medicine

## 2018-02-06 DIAGNOSIS — I2699 Other pulmonary embolism without acute cor pulmonale: Secondary | ICD-10-CM | POA: Diagnosis not present

## 2018-02-06 DIAGNOSIS — T8149XA Infection following a procedure, other surgical site, initial encounter: Secondary | ICD-10-CM | POA: Diagnosis not present

## 2018-02-06 DIAGNOSIS — J181 Lobar pneumonia, unspecified organism: Secondary | ICD-10-CM | POA: Diagnosis not present

## 2018-02-06 DIAGNOSIS — J9621 Acute and chronic respiratory failure with hypoxia: Secondary | ICD-10-CM | POA: Diagnosis not present

## 2018-02-06 LAB — PROTIME-INR
INR: 1.91
PROTHROMBIN TIME: 21.7 s — AB (ref 11.4–15.2)

## 2018-02-06 NOTE — Progress Notes (Signed)
Pulmonary Critical Care Medicine [facility] [place]   PULMONARY SERVICE  PROGRESS NOTE  Date of Service: 02/06/2018  Rodney Cordova  ZOX:[id number]  DOB: 06/29/1941   DOA: 12/16/2017  Referring Physician: Carron Curie, MD  HPI: Rodney Cordova is a 77 y.o. male seen for follow up of Acute on Chronic Respiratory Failure.  Patient is capping right now has been on 3 L doing very well should be In for more than 24 hours today  Medications: Reviewed on Rounds  Physical Exam:  Vitals: Temperature 98.1 pulse 102 respiratory rate 25 blood pressure 151/74 saturations 97%  Ventilator Settings off of the ventilator capping  . General: Comfortable at this time . Eyes: Grossly normal lids, irises & conjunctiva . ENT: grossly tongue is normal . Neck: no obvious mass . Cardiovascular: S1-S2 normal no gallop or rub . Respiratory: No rhonchi expansion is equal . Abdomen: Soft nontender . Skin: no rash seen on limited exam . Musculoskeletal: not rigid . Psychiatric:unable to assess . Neurologic: no seizure no involuntary movements         Labs on Admission:  Basic Metabolic Panel: Recent Labs  Lab 02/01/18 0704  NA 144  K 4.1  CL 100*  CO2 35*  GLUCOSE 103*  BUN 18  CREATININE 0.79  CALCIUM 8.5*    Liver Function Tests: No results for input(s): AST, ALT, ALKPHOS, BILITOT, PROT, ALBUMIN in the last 168 hours. No results for input(s): LIPASE, AMYLASE in the last 168 hours. No results for input(s): AMMONIA in the last 168 hours.  CBC: Recent Labs  Lab 02/01/18 0704  WBC 5.4  HGB 8.1*  HCT 27.3*  MCV 100.7*  PLT 332    Cardiac Enzymes: No results for input(s): CKTOTAL, CKMB, CKMBINDEX, TROPONINI in the last 168 hours.  BNP (last 3 results) No results for input(s): BNP in the last 8760 hours.  ProBNP (last 3 results) No results for input(s): PROBNP in the last 8760 hours.  Radiological Exams on Admission: No results found.  Assessment/Plan Active  Problems:   Pulmonary emboli (HCC)   Acute DVT (deep venous thrombosis) (HCC)   Postoperative intra-abdominal abscess   Acute on chronic respiratory failure with hypoxia (HCC)   Acute cholecystitis   Lobar pneumonia (HCC)   Essential hypertension   1. Acute on chronic respiratory failure with hypoxia patient will be continued on weaning with capping trials continue supportive care pulmonary toilet follow 2. Pulmonary emboli doing better we will continue with supportive care 3. Postoperative intra-abdominal abscess at baseline 4. Lobar pneumonia treated with antibiotics we will continue to follow   I have personally seen and evaluated the patient, evaluated laboratory and imaging results, formulated the assessment and plan and placed orders. The Patient requires high complexity decision making for assessment and support.  Case was discussed on Rounds with the Respiratory Therapy Staff  Yevonne Pax, MD [facility] Pulmonary Critical Care Medicine Sleep Medicine

## 2018-02-07 DIAGNOSIS — J181 Lobar pneumonia, unspecified organism: Secondary | ICD-10-CM | POA: Diagnosis not present

## 2018-02-07 DIAGNOSIS — T8149XA Infection following a procedure, other surgical site, initial encounter: Secondary | ICD-10-CM | POA: Diagnosis not present

## 2018-02-07 DIAGNOSIS — J9621 Acute and chronic respiratory failure with hypoxia: Secondary | ICD-10-CM | POA: Diagnosis not present

## 2018-02-07 DIAGNOSIS — I2699 Other pulmonary embolism without acute cor pulmonale: Secondary | ICD-10-CM | POA: Diagnosis not present

## 2018-02-07 LAB — PROTIME-INR
INR: 1.86
PROTHROMBIN TIME: 21.3 s — AB (ref 11.4–15.2)

## 2018-02-07 NOTE — Progress Notes (Signed)
Pulmonary Critical Care Medicine [facility] [place]   PULMONARY SERVICE  PROGRESS NOTE  Date of Service: 02/07/2018  Rodney Cordova  ZOX:[id number]  DOB: Feb 05, 1941   DOA: 12/16/2017  Referring Physician: Carron Curie, MD  HPI: Rodney Cordova is a 77 y.o. male seen for follow up of Acute on Chronic Respiratory Failure.  Patient currently is capping doing very well has been capped for 48 hours.  No issues no secretions good saturations on 2 L nasal cannula  Medications: Reviewed on Rounds  Physical Exam:  Vitals: Temperature 97.8 pulse 103 respiratory rate 29 blood pressure 134/75 saturations 93%  Ventilator Settings capping doing well  . General: Comfortable at this time . Eyes: Grossly normal lids, irises & conjunctiva . ENT: grossly tongue is normal . Neck: no obvious mass . Cardiovascular: S1-S2 normal no gallop . Respiratory: No rhonchi expansion is equal . Abdomen: Soft and nontender . Skin: no rash seen on limited exam . Musculoskeletal: not rigid . Psychiatric:unable to assess . Neurologic: no seizure no involuntary movements         Labs on Admission:  Basic Metabolic Panel: Recent Labs  Lab 02/01/18 0704  NA 144  K 4.1  CL 100*  CO2 35*  GLUCOSE 103*  BUN 18  CREATININE 0.79  CALCIUM 8.5*    Liver Function Tests: No results for input(s): AST, ALT, ALKPHOS, BILITOT, PROT, ALBUMIN in the last 168 hours. No results for input(s): LIPASE, AMYLASE in the last 168 hours. No results for input(s): AMMONIA in the last 168 hours.  CBC: Recent Labs  Lab 02/01/18 0704  WBC 5.4  HGB 8.1*  HCT 27.3*  MCV 100.7*  PLT 332    Cardiac Enzymes: No results for input(s): CKTOTAL, CKMB, CKMBINDEX, TROPONINI in the last 168 hours.  BNP (last 3 results) No results for input(s): BNP in the last 8760 hours.  ProBNP (last 3 results) No results for input(s): PROBNP in the last 8760 hours.  Radiological Exams on Admission: No results  found.  Assessment/Plan Active Problems:   Pulmonary emboli (HCC)   Acute DVT (deep venous thrombosis) (HCC)   Postoperative intra-abdominal abscess   Acute on chronic respiratory failure with hypoxia (HCC)   Acute cholecystitis   Lobar pneumonia (HCC)   Essential hypertension   1. Acute on chronic respiratory failure with hypoxia we will proceed to decannulation patient has passed the stress test for capping at this point 2. Pulmonary emboli treated we will continue to monitor 3. Postoperative intra-abdominal abscess resolved 4. Lobar pneumonia resolved    I have personally seen and evaluated the patient, evaluated laboratory and imaging results, formulated the assessment and plan and placed orders. The Patient requires high complexity decision making for assessment and support.  Case was discussed on Rounds with the Respiratory Therapy Staff  Yevonne Pax, MD [facility] Pulmonary Critical Care Medicine Sleep Medicine

## 2018-02-08 LAB — PROTIME-INR
INR: 1.73
Prothrombin Time: 20.1 seconds — ABNORMAL HIGH (ref 11.4–15.2)

## 2018-02-09 LAB — PROTIME-INR
INR: 1.85
Prothrombin Time: 21.2 seconds — ABNORMAL HIGH (ref 11.4–15.2)

## 2018-12-07 IMAGING — CT CT ABD-PELV W/ CM
2 of 5 series · 16 of 46 positions shown, 18 images · IV contrast (Omni 300)
Comparison: 12/17/2017

CLINICAL DATA: Gangrenous gallbladder abscess, continued fever 101
degrees

EXAM:
CT ABDOMEN AND PELVIS WITH CONTRAST
TECHNIQUE: Multidetector CT imaging of the abdomen and pelvis was performed
using the standard protocol following bolus administration of
intravenous contrast. Sagittal and coronal MPR images reconstructed
from axial data set.
CONTRAST:  100 mL [id number] IOPAMIDOL ([id number]) INJECTION 61%
IV. Dilute oral contrast.

[Series 3: a/p w/ 5mm · axial · 0.81mm/px · z∈[+898,+1338]mm · 13 of 98 slices shown, 15 images]
[im 5/98  soft-tissue]
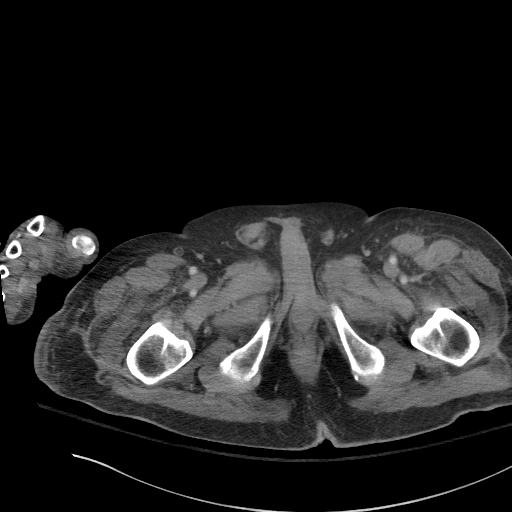
[im 5/98  bone]
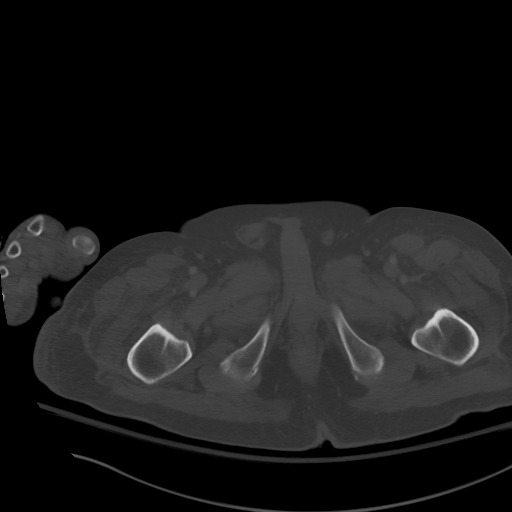
[im 14/98  soft-tissue]
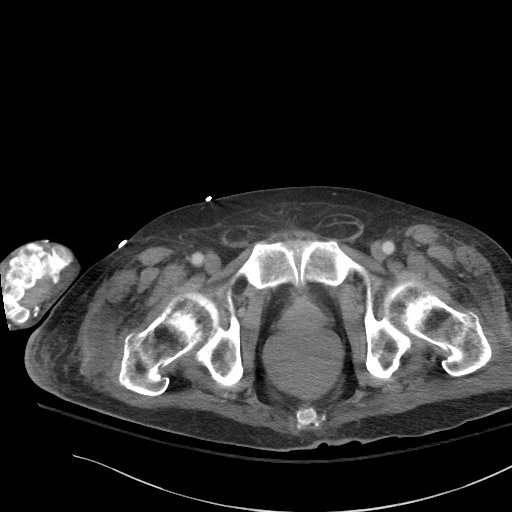
[im 19/98  soft-tissue]
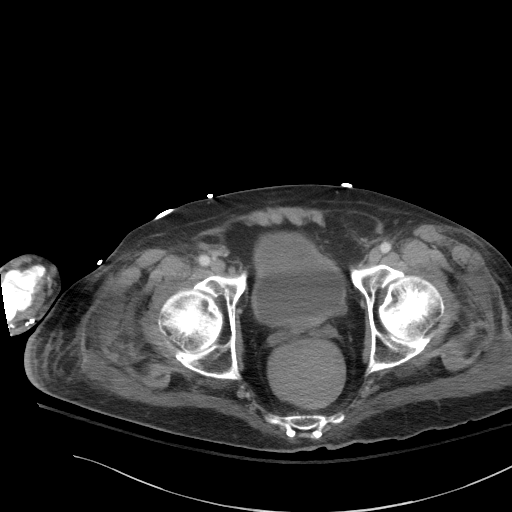
[im 28/98  soft-tissue]
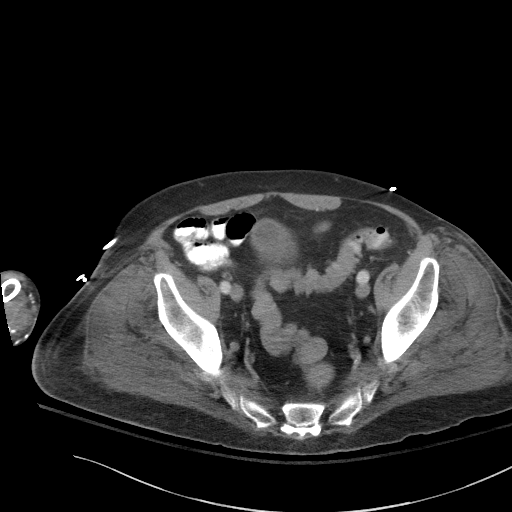
[im 33/98  soft-tissue]
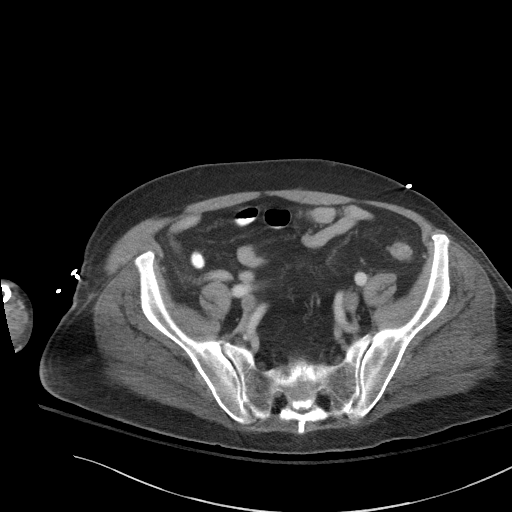
[im 42/98  soft-tissue]
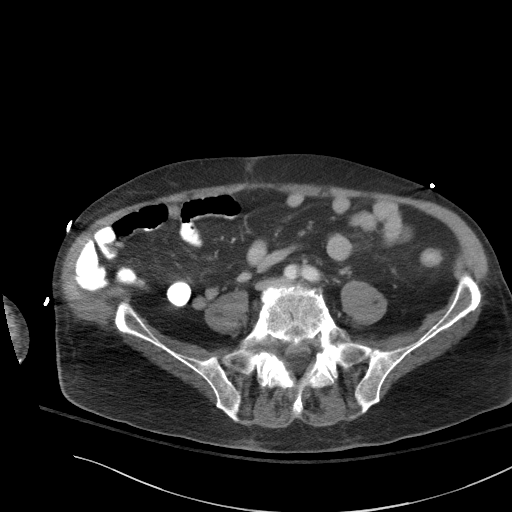
[im 51/98  soft-tissue]
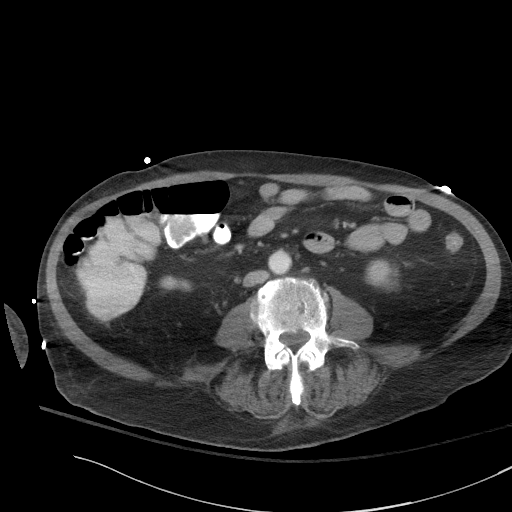
[im 56/98  soft-tissue]
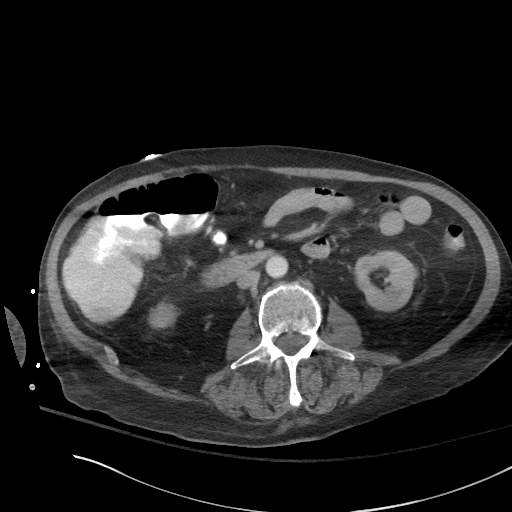
[im 65/98  soft-tissue]
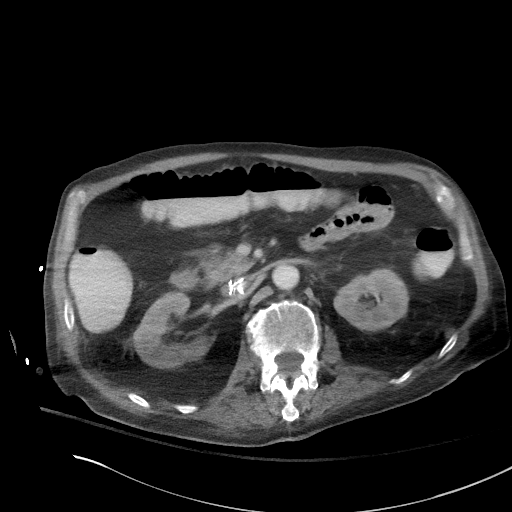
[im 65/98  bone]
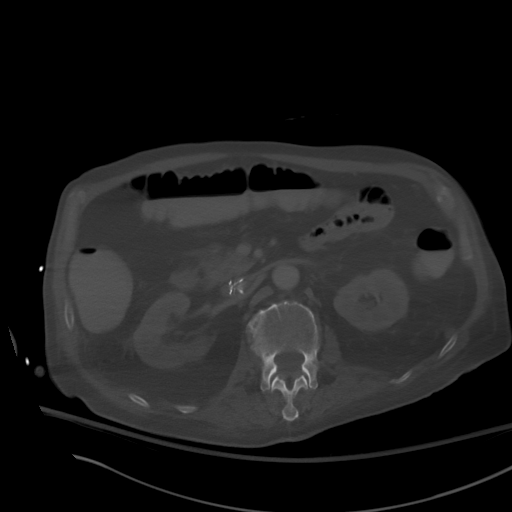
[im 70/98  soft-tissue]
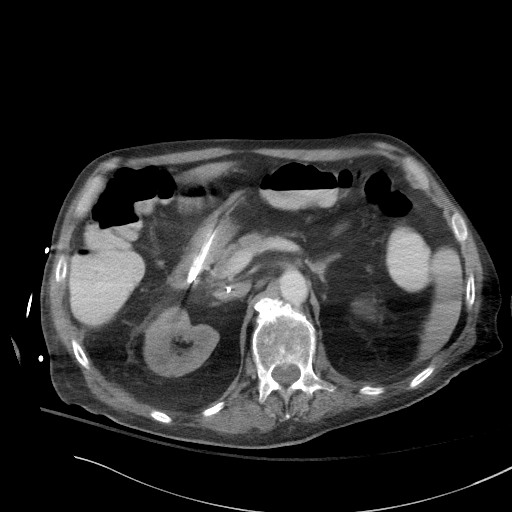
[im 79/98  soft-tissue]
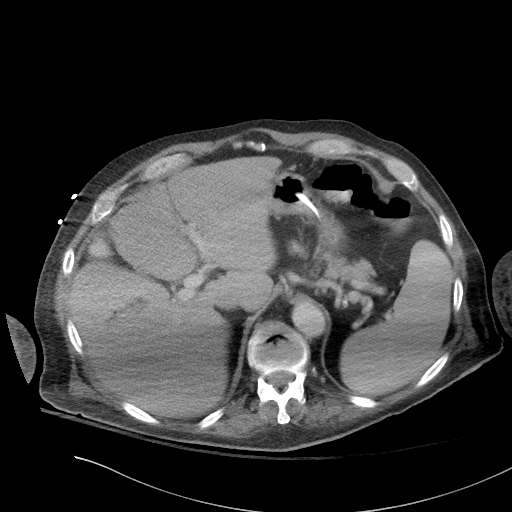
[im 84/98  soft-tissue]
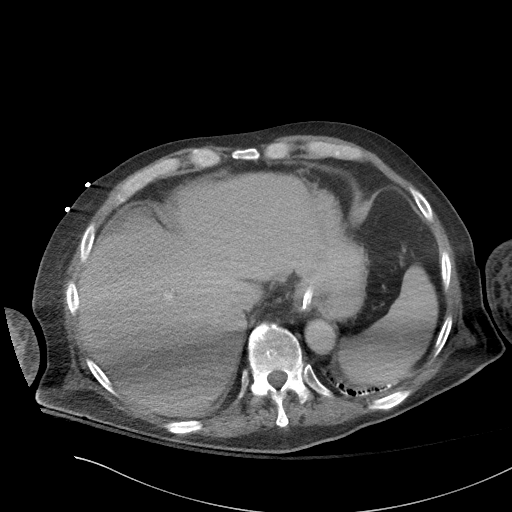
[im 93/98  soft-tissue]
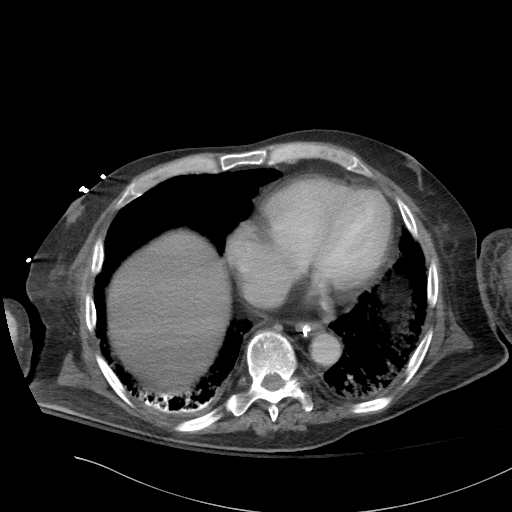

[Series 6: a/p w/ cor · coronal · 0.78mm/px · 3 of 151 slices shown]
[im 51/151  soft-tissue]
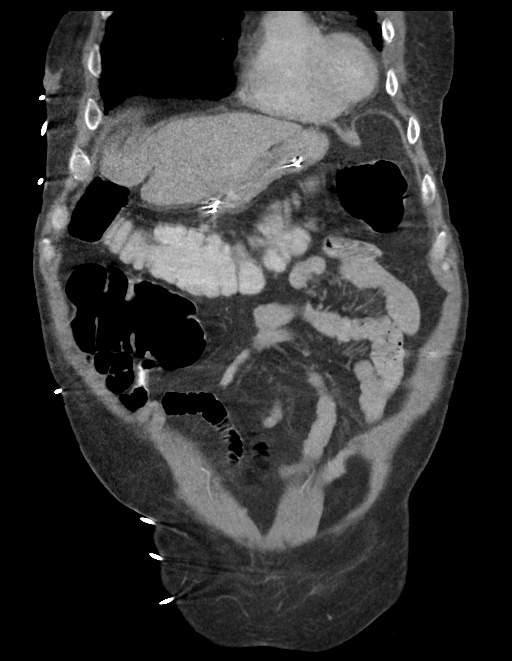
[im 67/151  soft-tissue]
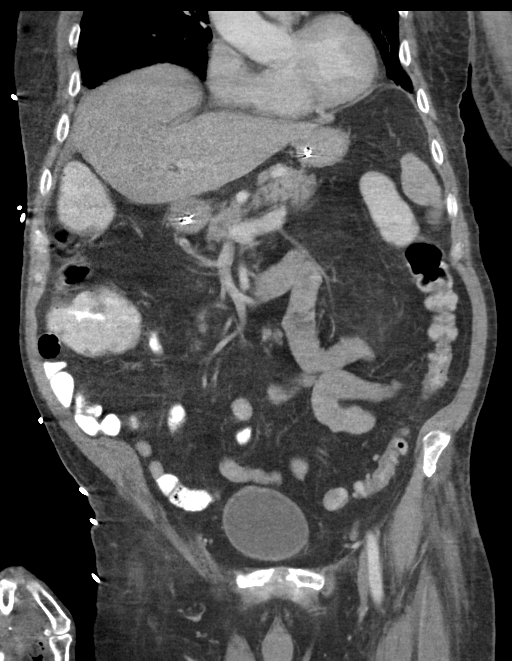
[im 84/151  soft-tissue]
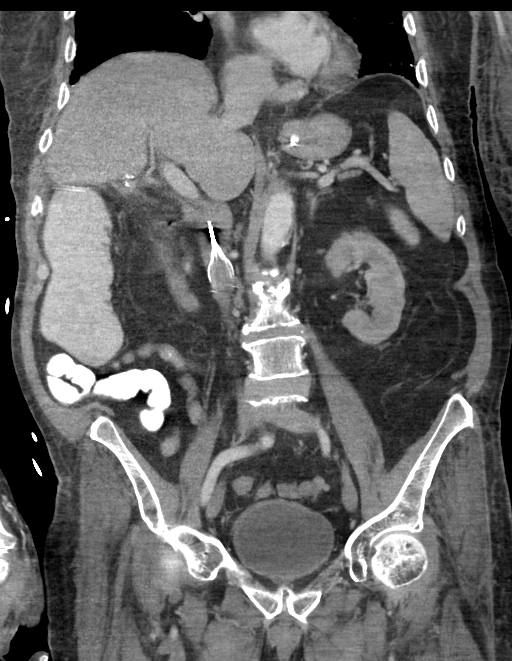

[16 of 46 positions shown; findings below may reference images not displayed]

FINDINGS: Lower chest: Bibasilar atelectasis and tiny RIGHT pleural effusion

Hepatobiliary: Gallbladder surgically absent. Abscess drain again
identified at the inferior RIGHT lobe liver with minimal residual
tissue thickening/chronic collection along the inner border of the
RIGHT lateral abdominal wall, slightly decreased. No focal hepatic
lesions.

Pancreas: Normal appearance

Spleen: Normal appearance

Adrenals/Urinary Tract: Adrenal glands, kidneys, ureters, and
bladder normal appearance

Stomach/Bowel: Resolution of ascending/transverse colonic wall
thickening seen on previous exam. Tip of nasogastric tube is at the
duodenal bulb. Sigmoid diverticulosis without evidence of
diverticulitis. Fluid-filled rectum. Stomach and bowel loops
otherwise normal appearance.

Vascular/Lymphatic: Atherosclerotic calcifications aorta without
aneurysm. IVC filter with tip at the level of the renal veins. No
adenopathy.

Reproductive: Unremarkable

Other: BILATERAL inguinal hernias containing fat. No free air or
free fluid. No inflammatory process.

Musculoskeletal: Bones demineralized. Chronic compression deformity
of the superior endplate of L1. Scattered degenerative disc disease
changes lumbar spine.
IMPRESSION: Sub appendix RIGHT upper quadrant abscess drain with slight decrease
in thickening/fluid inferior to the drain along the inner surface of
the RIGHT lateral abdominal wall.

No new abscess collections identified.

Sigmoid diverticulosis without diverticulitis changes.

Small BILATERAL inguinal hernias containing fat.

No new intra-abdominal or intrapelvic abnormalities.

## 2024-05-27 DEATH — deceased
